# Patient Record
Sex: Female | Born: 1955 | Race: White | Hispanic: No | Smoking: Never smoker
Health system: Southern US, Community
[De-identification: ages and names within clinical notes are randomized; demographics above are authoritative.]

## PROBLEM LIST (undated history)

## (undated) DIAGNOSIS — N289 Disorder of kidney and ureter, unspecified: Secondary | ICD-10-CM

## (undated) DIAGNOSIS — K760 Fatty (change of) liver, not elsewhere classified: Secondary | ICD-10-CM

## (undated) DIAGNOSIS — R002 Palpitations: Secondary | ICD-10-CM

## (undated) DIAGNOSIS — Q600 Renal agenesis, unilateral: Secondary | ICD-10-CM

## (undated) DIAGNOSIS — F32A Depression, unspecified: Secondary | ICD-10-CM

## (undated) DIAGNOSIS — R000 Tachycardia, unspecified: Secondary | ICD-10-CM

## (undated) DIAGNOSIS — E119 Type 2 diabetes mellitus without complications: Secondary | ICD-10-CM

## (undated) DIAGNOSIS — F329 Major depressive disorder, single episode, unspecified: Secondary | ICD-10-CM

## (undated) DIAGNOSIS — R42 Dizziness and giddiness: Secondary | ICD-10-CM

## (undated) DIAGNOSIS — R32 Unspecified urinary incontinence: Secondary | ICD-10-CM

## (undated) DIAGNOSIS — I10 Essential (primary) hypertension: Secondary | ICD-10-CM

## (undated) DIAGNOSIS — K589 Irritable bowel syndrome without diarrhea: Secondary | ICD-10-CM

## (undated) DIAGNOSIS — K219 Gastro-esophageal reflux disease without esophagitis: Secondary | ICD-10-CM

## (undated) DIAGNOSIS — I2510 Atherosclerotic heart disease of native coronary artery without angina pectoris: Secondary | ICD-10-CM

## (undated) DIAGNOSIS — E785 Hyperlipidemia, unspecified: Secondary | ICD-10-CM

## (undated) DIAGNOSIS — R600 Localized edema: Secondary | ICD-10-CM

## (undated) DIAGNOSIS — F988 Other specified behavioral and emotional disorders with onset usually occurring in childhood and adolescence: Secondary | ICD-10-CM

## (undated) DIAGNOSIS — L719 Rosacea, unspecified: Secondary | ICD-10-CM

## (undated) HISTORY — DX: Fatty (change of) liver, not elsewhere classified: K76.0

## (undated) HISTORY — PX: APPENDECTOMY: SHX54

## (undated) HISTORY — DX: Other specified behavioral and emotional disorders with onset usually occurring in childhood and adolescence: F98.8

## (undated) HISTORY — DX: Unspecified urinary incontinence: R32

## (undated) HISTORY — DX: Dizziness and giddiness: R42

## (undated) HISTORY — PX: LAPAROSCOPIC GASTRIC BANDING: SHX1100

## (undated) HISTORY — DX: Irritable bowel syndrome, unspecified: K58.9

## (undated) HISTORY — PX: KNEE SURGERY: SHX244

## (undated) HISTORY — DX: Localized edema: R60.0

## (undated) HISTORY — DX: Disorder of kidney and ureter, unspecified: N28.9

## (undated) HISTORY — DX: Rosacea, unspecified: L71.9

## (undated) HISTORY — DX: Hyperlipidemia, unspecified: E78.5

## (undated) HISTORY — PX: ROTATOR CUFF REPAIR: SHX139

## (undated) HISTORY — DX: Renal agenesis, unilateral: Q60.0

## (undated) HISTORY — PX: TONSILLECTOMY: SUR1361

## (undated) HISTORY — DX: Palpitations: R00.2

## (undated) HISTORY — PX: CHOLECYSTECTOMY: SHX55

## (undated) HISTORY — DX: Major depressive disorder, single episode, unspecified: F32.9

## (undated) HISTORY — DX: Tachycardia, unspecified: R00.0

## (undated) HISTORY — DX: Type 2 diabetes mellitus without complications: E11.9

## (undated) HISTORY — PX: ABDOMINAL HYSTERECTOMY: SHX81

## (undated) HISTORY — DX: Gastro-esophageal reflux disease without esophagitis: K21.9

## (undated) HISTORY — DX: Depression, unspecified: F32.A

---

## 2012-06-29 ENCOUNTER — Emergency Department (HOSPITAL_COMMUNITY)
Admission: EM | Admit: 2012-06-29 | Discharge: 2012-06-30 | Disposition: A | Discharge status: Home / Self Care | Payer: BC Managed Care – PPO | Attending: Emergency Medicine | Admitting: Emergency Medicine

## 2012-06-29 ENCOUNTER — Emergency Department (HOSPITAL_COMMUNITY): Payer: BC Managed Care – PPO

## 2012-06-29 ENCOUNTER — Encounter (HOSPITAL_COMMUNITY): Payer: Self-pay | Admitting: *Deleted

## 2012-06-29 DIAGNOSIS — I10 Essential (primary) hypertension: Secondary | ICD-10-CM | POA: Insufficient documentation

## 2012-06-29 DIAGNOSIS — Y9389 Activity, other specified: Secondary | ICD-10-CM | POA: Insufficient documentation

## 2012-06-29 DIAGNOSIS — S46909A Unspecified injury of unspecified muscle, fascia and tendon at shoulder and upper arm level, unspecified arm, initial encounter: Secondary | ICD-10-CM | POA: Insufficient documentation

## 2012-06-29 DIAGNOSIS — W19XXXA Unspecified fall, initial encounter: Secondary | ICD-10-CM

## 2012-06-29 DIAGNOSIS — Z79899 Other long term (current) drug therapy: Secondary | ICD-10-CM | POA: Insufficient documentation

## 2012-06-29 DIAGNOSIS — I2510 Atherosclerotic heart disease of native coronary artery without angina pectoris: Secondary | ICD-10-CM | POA: Insufficient documentation

## 2012-06-29 DIAGNOSIS — M25519 Pain in unspecified shoulder: Secondary | ICD-10-CM

## 2012-06-29 DIAGNOSIS — Z7982 Long term (current) use of aspirin: Secondary | ICD-10-CM | POA: Insufficient documentation

## 2012-06-29 DIAGNOSIS — W010XXA Fall on same level from slipping, tripping and stumbling without subsequent striking against object, initial encounter: Secondary | ICD-10-CM | POA: Insufficient documentation

## 2012-06-29 DIAGNOSIS — Y9289 Other specified places as the place of occurrence of the external cause: Secondary | ICD-10-CM | POA: Insufficient documentation

## 2012-06-29 DIAGNOSIS — M79601 Pain in right arm: Secondary | ICD-10-CM

## 2012-06-29 DIAGNOSIS — S4980XA Other specified injuries of shoulder and upper arm, unspecified arm, initial encounter: Secondary | ICD-10-CM | POA: Insufficient documentation

## 2012-06-29 HISTORY — DX: Essential (primary) hypertension: I10

## 2012-06-29 HISTORY — DX: Atherosclerotic heart disease of native coronary artery without angina pectoris: I25.10

## 2012-06-29 NOTE — ED Notes (Signed)
Pt transported to xray 

## 2012-06-29 NOTE — ED Notes (Signed)
The pt tripped and fell over a curb at the [facility] just pta.   No loc.  C/o  Rt upper arm and  Rt eyebrow  Laceration where her glasses  Were broken.

## 2012-06-29 NOTE — ED Notes (Signed)
ED PA at bedside

## 2012-06-29 NOTE — ED Provider Notes (Signed)
History    This chart was scribed for non-physician practitioner Raymon Mutton working with Carleene Cooper III, MD by Quintella Reichert, ED Scribe. This patient was seen in room TR06C/TR06C and the patient's care was started at 11:14 PM .   CSN: [id number]  Arrival date & time 06/29/12  2137      Chief Complaint  Patient presents with  . Fall     The history is provided by the patient. No language interpreter was used.   Mallory Charles is a 57 y.o. female who presents to the Emergency Department due to a fall over a curb several hours ago, with subsequent upper right arm pain that is exacerbated by arm movement.  Pt reports that she fell forward, but does not recall exactly what position she fell in. She denies LOC or head trauma with fall.  Pt denies visual changes, dizziness, CP, SOB, difficulty breathing, ear pain, tinnitus, HA, abdominal pain, back pain, neck pain, weakness, or any other associated symptoms.  Pt has h/o HTN and CAD.   Past Medical History  Diagnosis Date  . Hypertension   . Coronary artery disease     History reviewed. No pertinent past surgical history.  No family history on file.  History  Substance Use Topics  . Smoking status: Never Smoker   . Smokeless tobacco: Not on file  . Alcohol Use: No    OB History   Grav Para Term Preterm Abortions TAB SAB Ect Mult Living                  Review of Systems  HENT: Negative for ear pain, neck pain and tinnitus.   Eyes: Negative for visual disturbance.  Respiratory: Negative for shortness of breath.   Cardiovascular: Negative for chest pain.  Gastrointestinal: Negative for abdominal pain.  Musculoskeletal: Negative for back pain.       Upper right arm pain  Neurological: Negative for dizziness, weakness and headaches.  All other systems reviewed and are negative.    Allergies  Morphine and related  Home Medications   Current Outpatient Rx  Name  Route  Sig  Dispense  Refill  . aspirin 81  MG chewable tablet   Oral   Chew 81 mg by mouth daily.         . cholecalciferol (VITAMIN D) 1000 UNITS tablet   Oral   Take 1,000 Units by mouth daily.         . DULoxetine (CYMBALTA) 20 MG capsule   Oral   Take 20 mg by mouth daily.         Marland Kitchen losartan-hydrochlorothiazide (HYZAAR) 50-12.5 MG per tablet   Oral   Take 1 tablet by mouth daily.         . metFORMIN (GLUCOPHAGE-XR) 750 MG 24 hr tablet   Oral   Take 750 mg by mouth 2 (two) times daily.         . metoprolol succinate (TOPROL-XL) 25 MG 24 hr tablet   Oral   Take 12.5 mg by mouth daily.         Marland Kitchen omeprazole (PRILOSEC) 40 MG capsule   Oral   Take 40 mg by mouth daily.         Marland Kitchen HYDROcodone-acetaminophen (NORCO) 5-325 MG per tablet   Oral   Take 1 tablet by mouth every 6 (six) hours as needed for pain.   6 tablet   0     BP 133/62  Pulse 93  Temp(Src) 97.9  F (36.6 C) (Oral)  Resp 18  Ht 5' 5.5" (1.664 m)  Wt 309 lb (140.161 kg)  BMI 50.62 kg/m2  SpO2 100%  Physical Exam  Nursing note and vitals reviewed. Constitutional: She is oriented to person, place, and time. She appears well-developed and well-nourished. No distress.  HENT:  Head: Normocephalic and atraumatic.  Mouth/Throat: Oropharynx is clear and moist. No oropharyngeal exudate.  Eyes: Conjunctivae and EOM are normal. Pupils are equal, round, and reactive to light. Right eye exhibits no discharge. Left eye exhibits no discharge.  Neck: Normal range of motion. Neck supple. No tracheal deviation present.  Cardiovascular: Normal rate, regular rhythm and normal heart sounds.   No murmur heard. Radial pulses 2+ bilaterally.  Pedal pulses 2+ bilaterally.  Pulmonary/Chest: Effort normal and breath sounds normal. No respiratory distress. She has no wheezes. She has no rales.  Musculoskeletal: She exhibits tenderness (Tenderness on palpation to right humerus. ).  No swelling, erythema, inflammation noted to upper extremities and lower  extremities bilaterally. Mild discomfort upon palpation to right shoulder - pain with palpation to right humerus. No pain upon palpation to right elbow, right forearm, right hand. Patient unable to move right shoulder due to pain in right humerus. Full ROM to right elbow, right wrist, right hand.    Pain upon motion to left shoulder, has full ROM without pain to left elbow, left wrist, left hand.   Lymphadenopathy:    She has no cervical adenopathy.  Neurological: She is alert and oriented to person, place, and time. No cranial nerve deficit. She exhibits normal muscle tone.  Skin: Skin is warm and dry. No rash noted. No erythema.  No abrasions to lower extremities. Superficial abrasion to right medial epicondyle. 2 small linear scrapes above right eyebrow, tiny scrapes to right eyelid.   Psychiatric: She has a normal mood and affect. Her behavior is normal. Thought content normal.    ED Course  Procedures (including critical care time)  DIAGNOSTIC STUDIES: Oxygen Saturation is 100% on room air, normal by my interpretation.    COORDINATION OF CARE: 11:21 PM-Discussed treatment plan which includes pain medication and imaging with pt at bedside and pt agreed to plan.      Labs Reviewed - No data to display Dg Shoulder Right  06/30/2012  *RADIOLOGY REPORT*  Clinical Data: Status post fall; right shoulder pain.  RIGHT SHOULDER - 2+ VIEW  Comparison: None.  Findings: There is no evidence of fracture or dislocation.  The right humeral head is seated within the glenoid fossa. Degenerative change is noted at the right acromioclavicular joint. No significant soft tissue abnormalities are seen.  The visualized portions of the right lung are clear.  IMPRESSION: No evidence of fracture or dislocation.   Original Report Authenticated By: Tonia Ghent, M.D.    Dg Elbow Complete Right  06/30/2012  *RADIOLOGY REPORT*  Clinical Data: Status post fall, with right elbow pain and small abrasion at the  posterior aspect of the elbow.  RIGHT ELBOW - COMPLETE 3+ VIEW  Comparison: None.  Findings: There is a small apparent cortical defect on a single lateral view, at the dorsal aspect of the radial head.  This is thought to be artifactual in nature, given the lack of an associated joint effusion, and question of underlying intact cortex on that view.  There is no additional evidence for fracture.  The visualized joint spaces are preserved.  No significant joint effusion is identified. The known soft tissue abrasion is not well characterized  on radiograph.  IMPRESSION: No definite evidence of fracture or dislocation.  Small apparent cortical defect at the dorsal aspect of the radial head on a single lateral view is thought to be artifactual in nature.   Original Report Authenticated By: Tonia Ghent, M.D.    Dg Wrist Complete Right  06/30/2012  *RADIOLOGY REPORT*  Clinical Data: Status post fall; right wrist pain.  RIGHT WRIST - COMPLETE 3+ VIEW  Comparison: None.  Findings: There is no evidence of acute fracture or dislocation. A small osseous fragment distal to the ulnar styloid may reflect remote injury.  The carpal rows are intact, and demonstrate normal alignment.  The joint spaces are preserved.  No significant soft tissue abnormalities are seen.  IMPRESSION: No evidence of acute fracture or dislocation.   Original Report Authenticated By: Tonia Ghent, M.D.    Dg Shoulder Left  06/30/2012  *RADIOLOGY REPORT*  Clinical Data: Status post fall; left shoulder pain.  LEFT SHOULDER - 2+ VIEW  Comparison: None.  Findings: There is no evidence of fracture or dislocation.  The left humeral head is seated within the glenoid fossa.  The acromioclavicular joint is unremarkable in appearance.  Note is made of a calcification at the distal insertion of the left rotator cuff, compatible with calcific tendinitis.  The visualized portions of the left lung are clear.  IMPRESSION:  1.  No evidence of fracture or  dislocation. 2.  Findings compatible with calcific tendinitis at the left rotator cuff.   Original Report Authenticated By: Tonia Ghent, M.D.    Dg Humerus Right  06/30/2012  *RADIOLOGY REPORT*  Clinical Data: Status post fall; right arm injury, with pain.  RIGHT HUMERUS - 2+ VIEW  Comparison: None.  Findings: There is no evidence of fracture or dislocation.  The right humerus appears intact.  The right humeral head remains seated at the glenoid fossa.  Mild degenerative change is noted at the right acromioclavicular joint.  The elbow joint is assessed on concurrent elbow radiographs.  No significant soft tissue abnormalities are characterized.  IMPRESSION: No evidence of fracture or dislocation.   Original Report Authenticated By: Tonia Ghent, M.D.      1. Fall, initial encounter   2. Shoulder pain, unspecified laterality   3. Right arm pain       MDM  Patient is afebrile, normotensive, non-tachycardic, alert and oriented. Negative findings for fractures and dislocations to right shoulder, right humerus, right elbow, right wrist, and left shoulder. Due to pain in right humerus and limited motion to right shoulder patient placed in sling. Pain controlled in ED setting. Discharged patient with pain medications - discussed to not drink, drive, operate heavy machinery while taking pain medications. Stressed importance of following up with orthopedics in regards to injury. Discussed with patient to follow-up with PCP, [facility]. Discussed with patient on wound care to abrasions. Discussed with patient to continue to stay in sling until seen by orthopedics - discussed to elevate, ice, rest arm. Discussed with patient to monitor symptoms and if symptoms are to worsen or change to report back to the ED. Patient agreed to plan of care, understood, all questions answered.    I personally performed the services described in this documentation, which was scribed in my presence. The recorded  information has been reviewed and is accurate.   Raymon Mutton, PA-C 06/30/12 0320 No neurovascular damage noted.   Raymon Mutton, PA-C 06/30/12 [id number]

## 2012-06-30 MED ORDER — FENTANYL CITRATE 0.05 MG/ML IJ SOLN
2.0000 ug/kg | Freq: Once | INTRAMUSCULAR | Status: DC
  Filled 2012-06-30: qty 6

## 2012-06-30 MED ORDER — HYDROCODONE-ACETAMINOPHEN 5-325 MG PO TABS: 1.0000 | ORAL_TABLET | Freq: Four times a day (QID) | ORAL | Status: DC | PRN

## 2012-06-30 MED ORDER — FENTANYL CITRATE 0.05 MG/ML IJ SOLN
50.0000 ug | Freq: Once | INTRAMUSCULAR | Status: AC
  Administered 2012-06-30: 50 ug via NASAL
  Filled 2012-06-30: qty 2

## 2012-06-30 NOTE — ED Notes (Signed)
Pt comfortable with discharge and f/u instructions. Prescription x1

## 2012-07-02 NOTE — ED Provider Notes (Signed)
Medical screening examination/treatment/procedure(s) were performed by non-physician practitioner and as supervising physician I was immediately available for consultation/collaboration.   Carleene Cooper III, MD 07/02/12 1115

## 2013-03-08 ENCOUNTER — Ambulatory Visit: Payer: BC Managed Care – PPO

## 2013-03-08 ENCOUNTER — Ambulatory Visit (INDEPENDENT_AMBULATORY_CARE_PROVIDER_SITE_OTHER): Payer: BC Managed Care – PPO | Admitting: Family Medicine

## 2013-03-08 VITALS — BP 132/70 | HR 80 | Temp 98.2°F | Resp 18 | Wt 306.0 lb

## 2013-03-08 DIAGNOSIS — M25572 Pain in left ankle and joints of left foot: Secondary | ICD-10-CM

## 2013-03-08 DIAGNOSIS — M25579 Pain in unspecified ankle and joints of unspecified foot: Secondary | ICD-10-CM

## 2013-03-08 NOTE — Progress Notes (Signed)
Subjective: Last night went and bent over to give someone a cast and she bumped her foot into the chair. She says it is felt like popping and cracking and crackling. He continues to hurt.  Objective: Left ankle is mildly swollen, more medially. It is moderately tender, more on the medial malleolus. Dorsiflexion, plantarflexion, inversion, and eversion, all cause some pain. Pulses are good. Motion of the toes is good. Toes are nontender.  [facility] reading (PRIMARY) by  Dr. Alwyn Ren Ankle sprain  Assessment: Ankle sprain and pain  Plan: Swede-O Ibuprofen Return if worse Use her own crutches as needed.

## 2013-03-08 NOTE — Patient Instructions (Signed)
Use the crutches you have at home as needed  Wear the ankle brace whenever you're going to be up and around  Take ibuprofen 6-800 mg 3 times daily as needed for pain and inflammation  Return if not improving over the next week or so  Try to elevate as much as you can and use ice packs for 5 times daily on it

## 2014-05-26 IMAGING — CR DG SHOULDER 2+V*L*
3 series · 3 of 3 positions shown · non-contrast
Comparison: None.

CLINICAL DATA: Status post fall; left shoulder pain.

LEFT SHOULDER - 2+ VIEW

[t shoulder internal left]
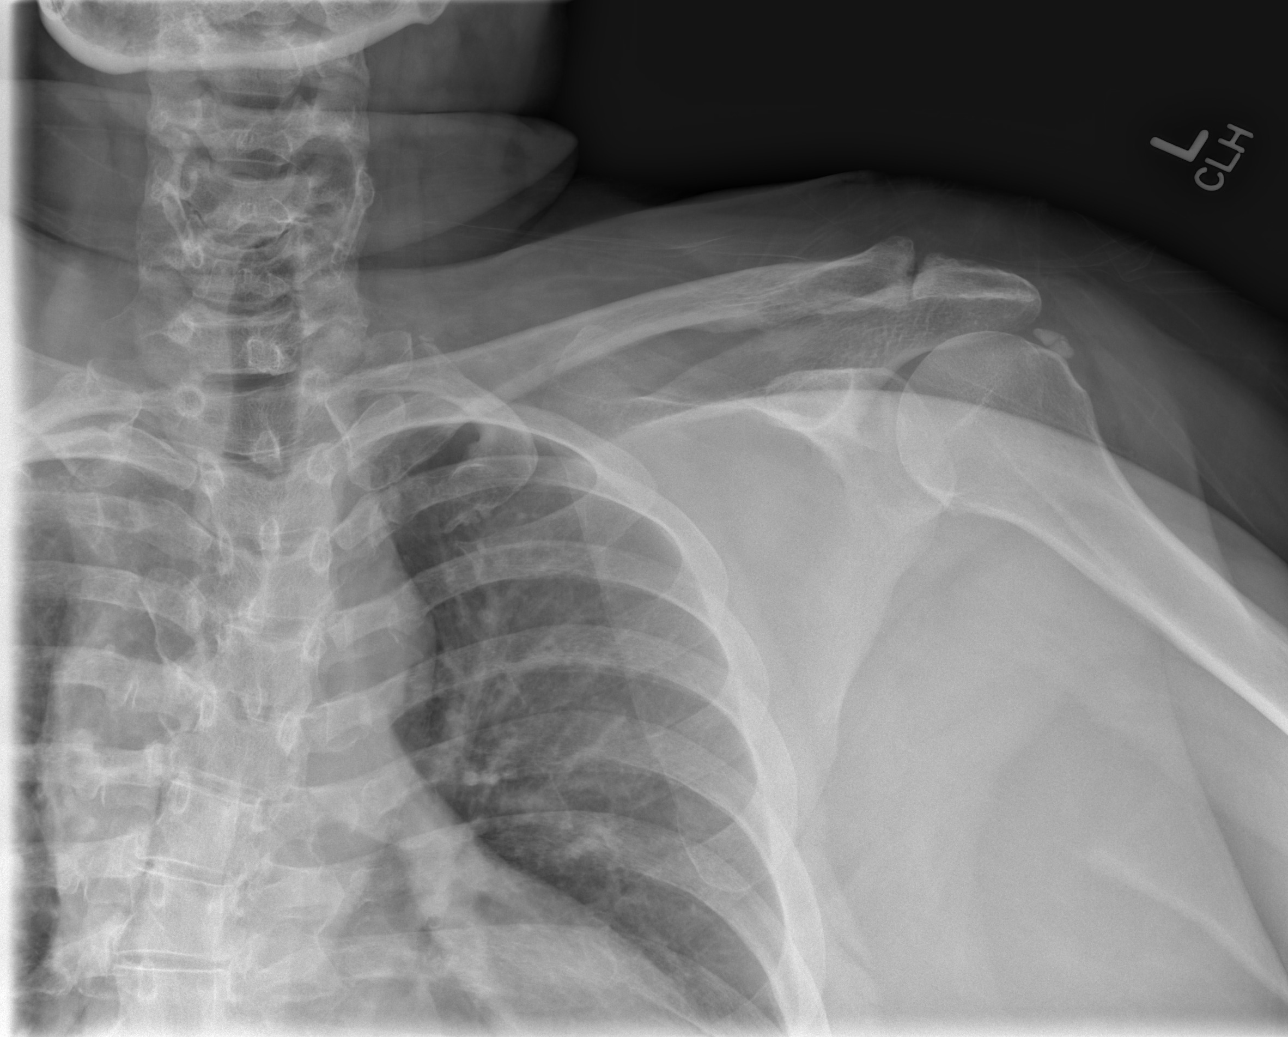

[t shoulder external left]
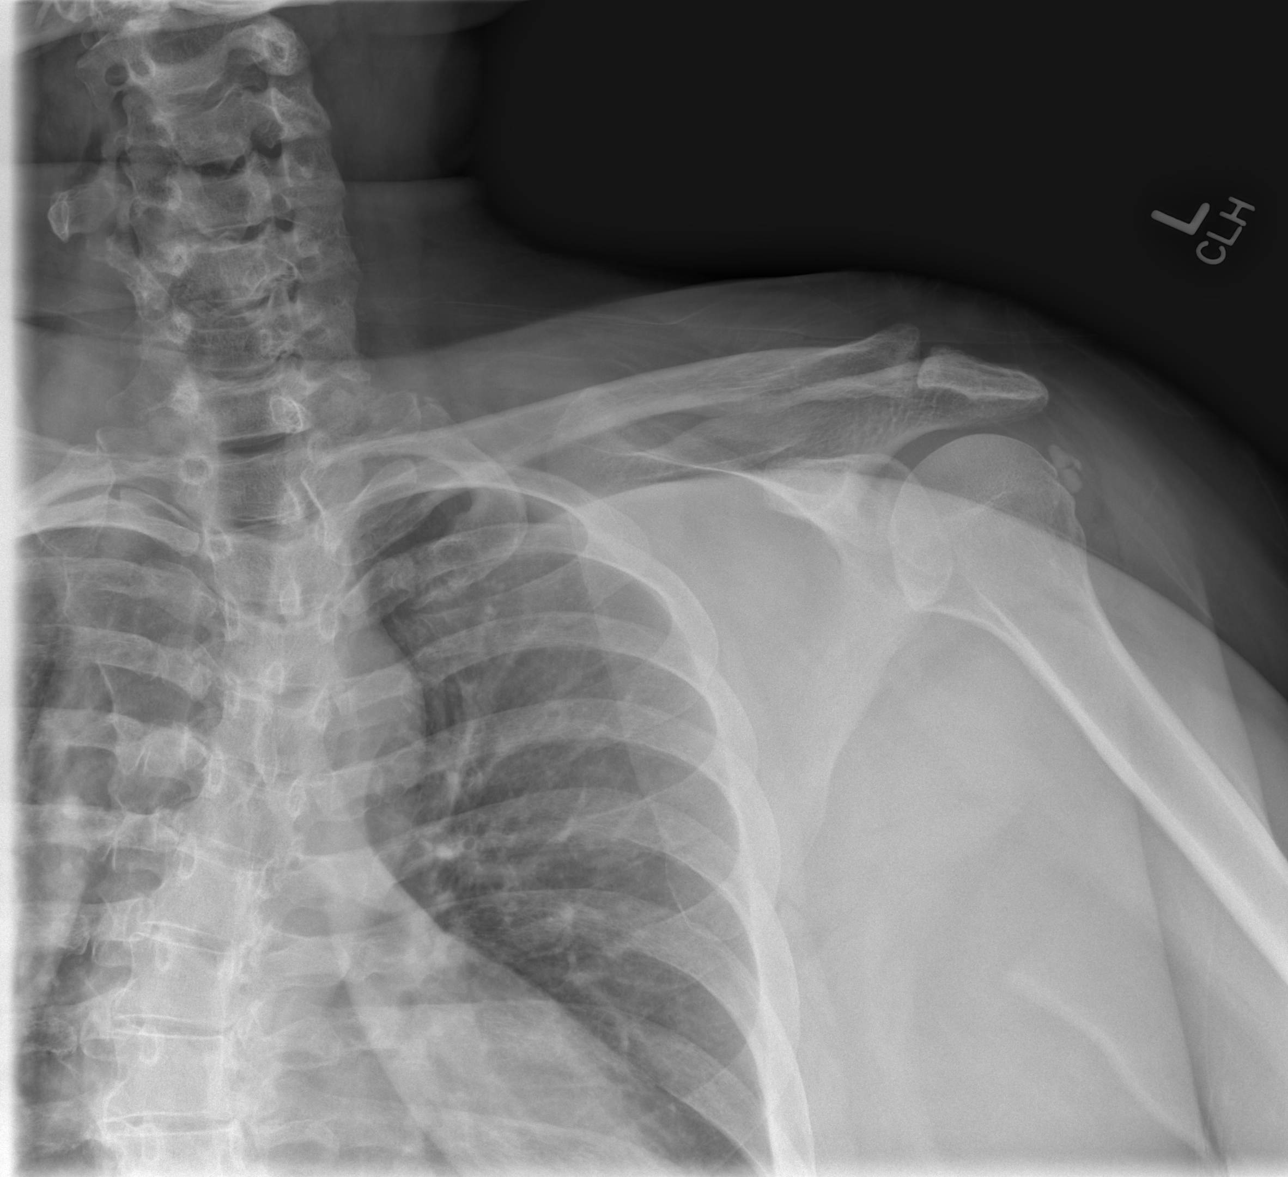

[t shoulder y-view left]
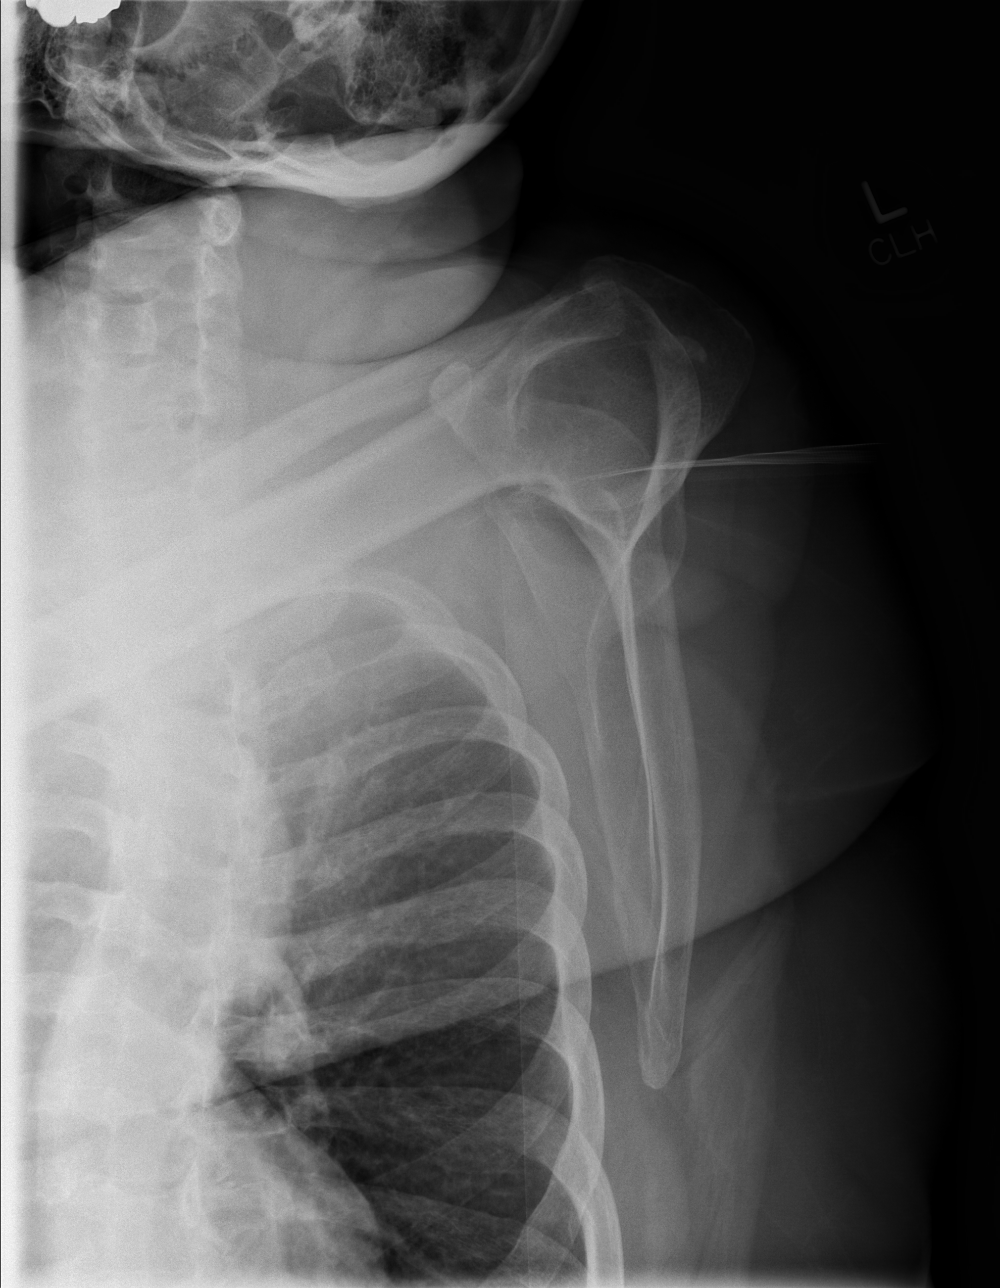

[3 of 3 positions shown; findings below may reference images not displayed]

FINDINGS: There is no evidence of fracture or dislocation.  The
left humeral head is seated within the glenoid fossa.  The
acromioclavicular joint is unremarkable in appearance.

Note is made of a calcification at the distal insertion of the left
rotator cuff, compatible with calcific tendinitis.  The visualized
portions of the left lung are clear.
IMPRESSION: 1.  No evidence of fracture or dislocation.
2.  Findings compatible with calcific tendinitis at the left
rotator cuff.

## 2014-07-30 ENCOUNTER — Other Ambulatory Visit: Payer: Self-pay | Admitting: Surgical Oncology

## 2014-07-30 DIAGNOSIS — IMO0001 Reserved for inherently not codable concepts without codable children: Secondary | ICD-10-CM

## 2014-07-30 DIAGNOSIS — K219 Gastro-esophageal reflux disease without esophagitis: Principal | ICD-10-CM

## 2014-09-02 ENCOUNTER — Ambulatory Visit
Admission: RE | Admit: 2014-09-02 | Discharge: 2014-09-02 | Disposition: A | Discharge status: Home / Self Care | Payer: BC Managed Care – PPO | Source: Ambulatory Visit | Attending: Surgical Oncology | Admitting: Surgical Oncology

## 2014-09-02 DIAGNOSIS — K219 Gastro-esophageal reflux disease without esophagitis: Principal | ICD-10-CM

## 2014-09-02 DIAGNOSIS — IMO0001 Reserved for inherently not codable concepts without codable children: Secondary | ICD-10-CM

## 2015-02-02 IMAGING — CR DG ANKLE COMPLETE 3+V*L*
4 series · 4 of 4 positions shown · non-contrast
Comparison: None.

CLINICAL DATA: Left ankle pain.

EXAM:
LEFT ANKLE COMPLETE - 3+ VIEW

[AP]
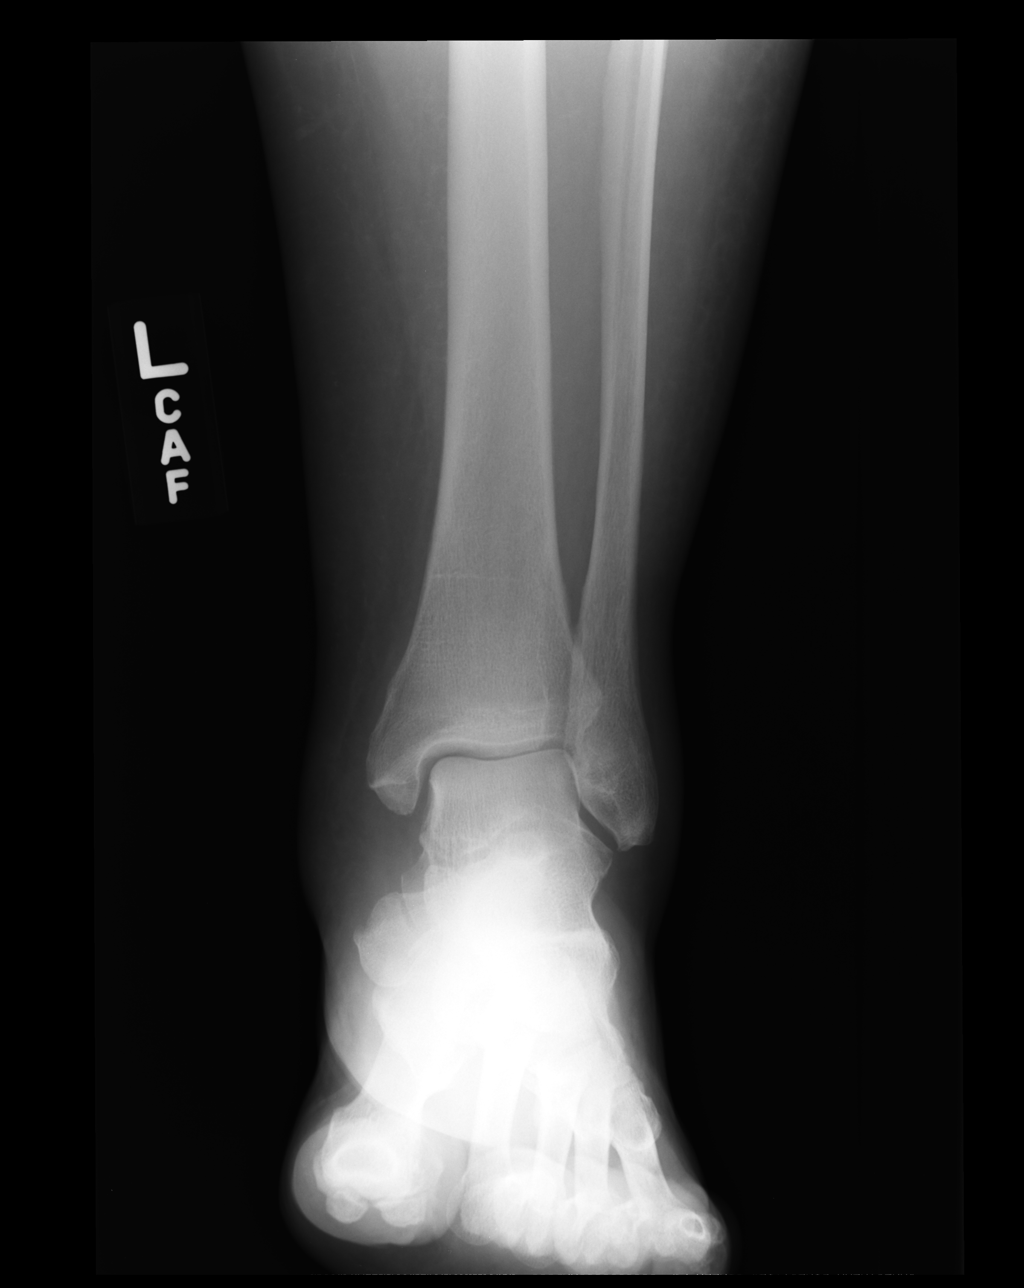

[ap obl int rot]
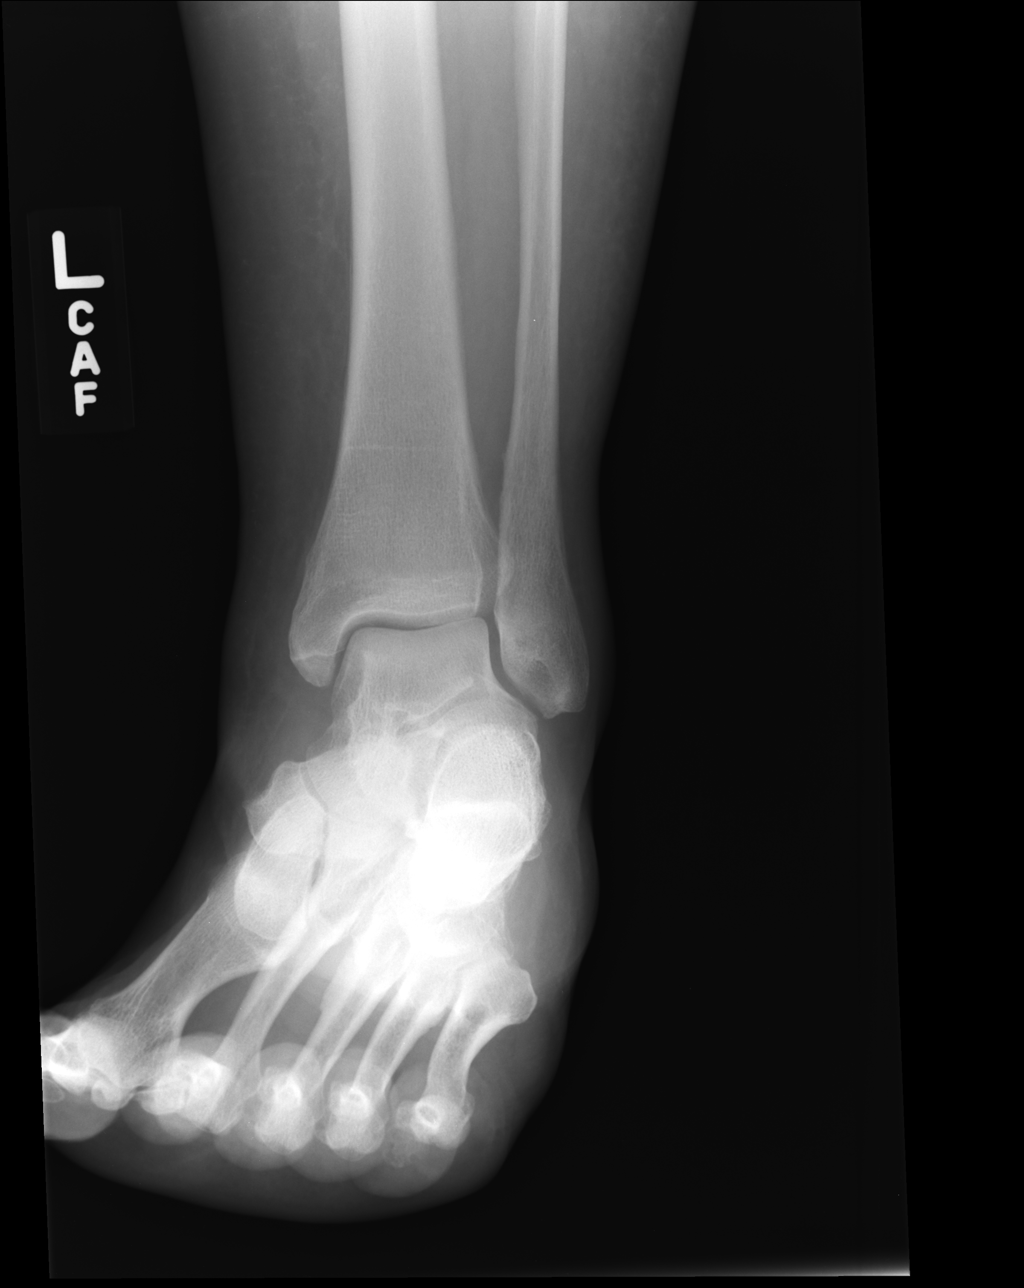

[ap obl ext rot]
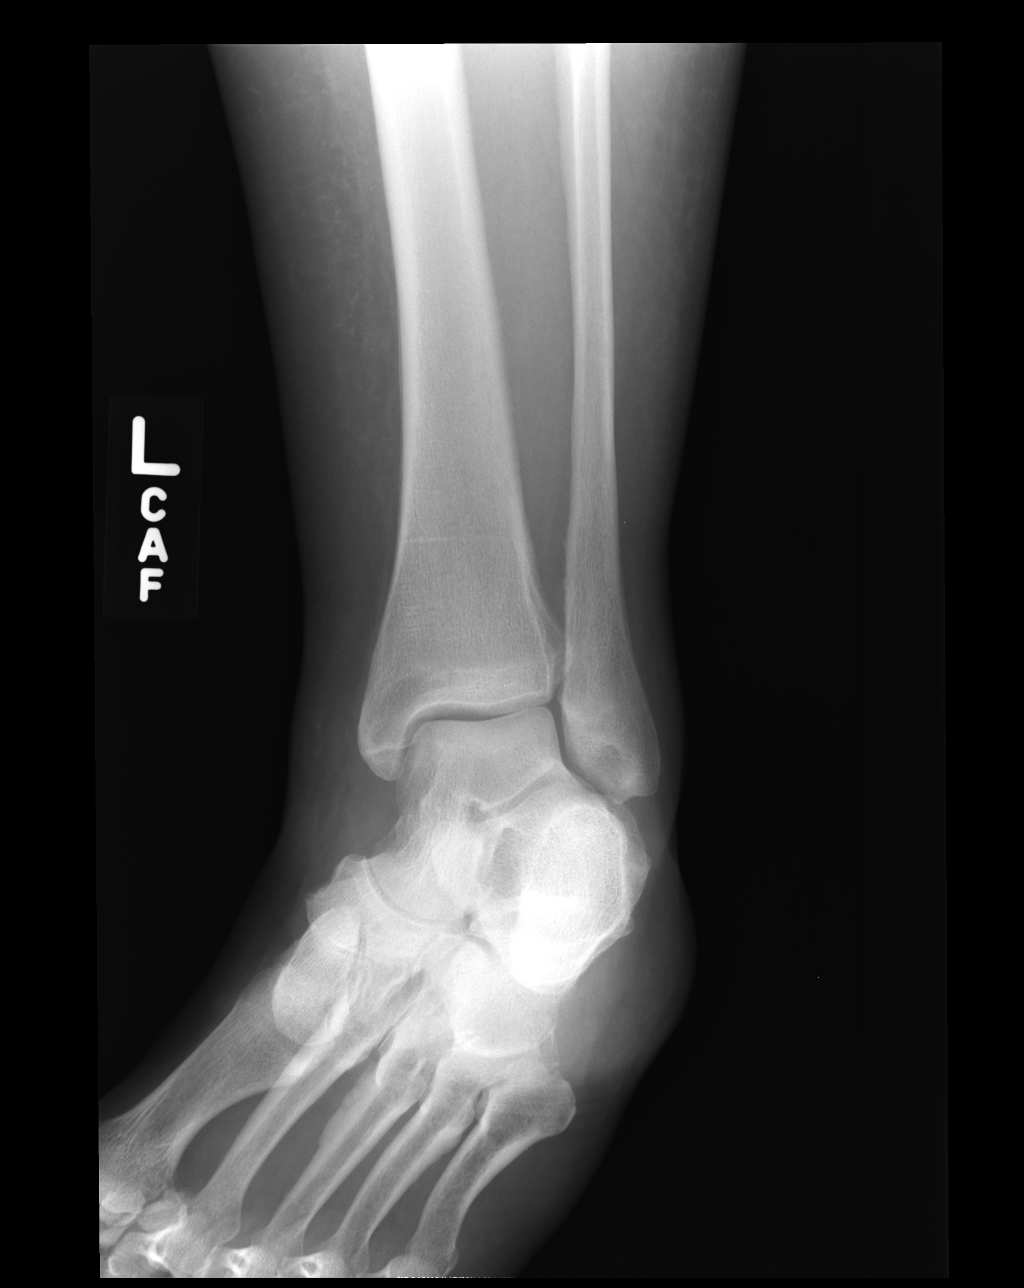

[lateral]
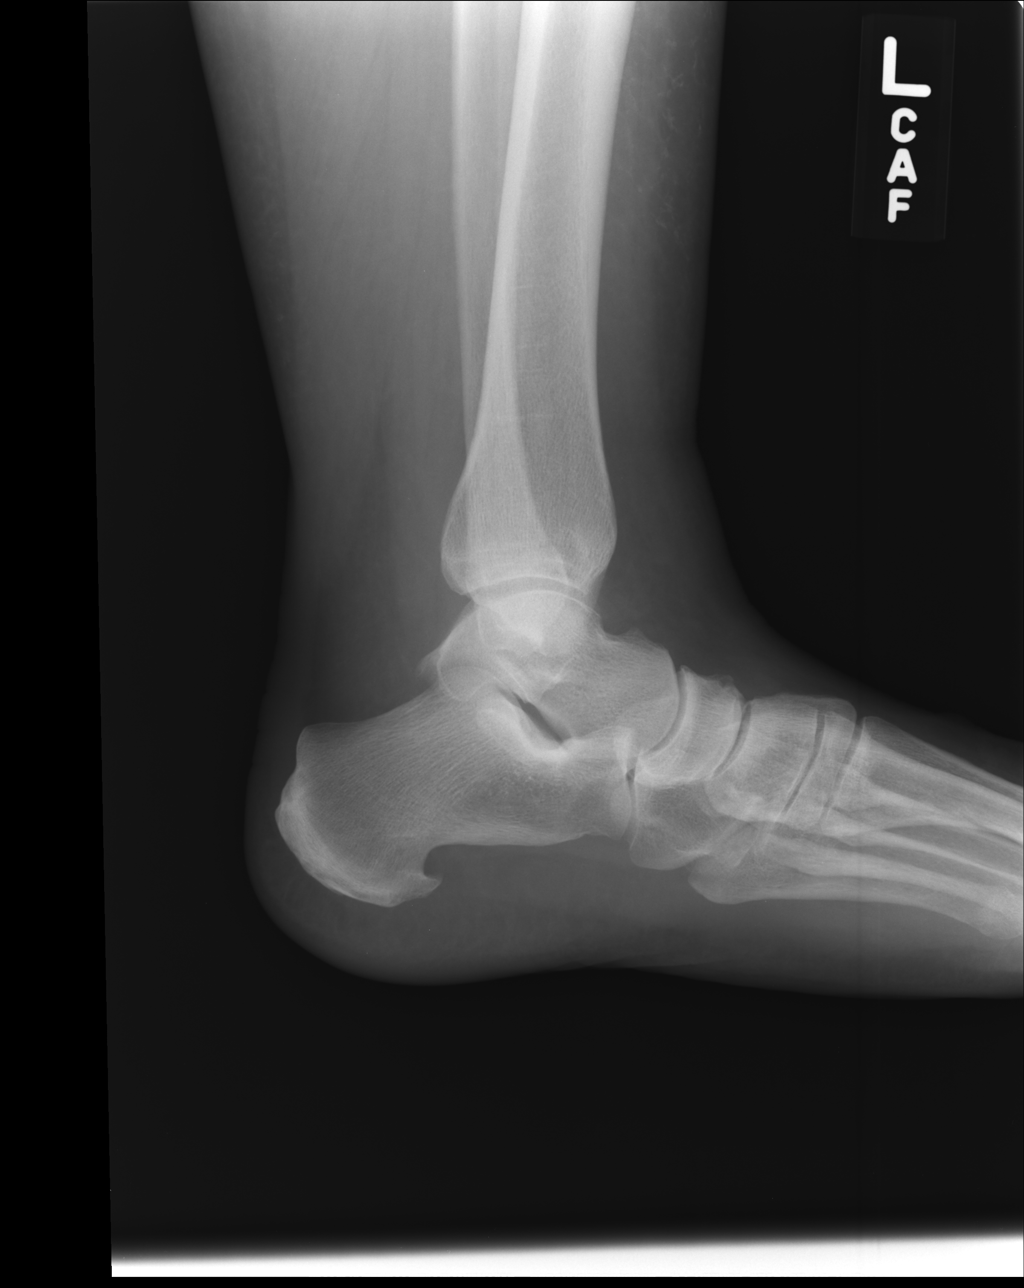

[4 of 4 positions shown; findings below may reference images not displayed]

FINDINGS: No acute bony or joint abnormality is identified. Plantar calcaneal
spur is noted.
IMPRESSION: No acute finding.

## 2016-06-05 ENCOUNTER — Other Ambulatory Visit: Payer: Self-pay | Admitting: Surgical Oncology

## 2016-06-05 DIAGNOSIS — R1013 Epigastric pain: Secondary | ICD-10-CM

## 2016-06-05 DIAGNOSIS — K219 Gastro-esophageal reflux disease without esophagitis: Secondary | ICD-10-CM

## 2016-06-13 ENCOUNTER — Other Ambulatory Visit: Payer: Self-pay | Admitting: Surgical Oncology

## 2016-06-13 ENCOUNTER — Ambulatory Visit
Admission: RE | Admit: 2016-06-13 | Discharge: 2016-06-13 | Disposition: A | Discharge status: Home / Self Care | Payer: BC Managed Care – PPO | Source: Ambulatory Visit | Attending: Surgical Oncology | Admitting: Surgical Oncology

## 2016-06-13 DIAGNOSIS — R1013 Epigastric pain: Secondary | ICD-10-CM

## 2016-06-13 DIAGNOSIS — K219 Gastro-esophageal reflux disease without esophagitis: Secondary | ICD-10-CM

## 2020-09-08 ENCOUNTER — Ambulatory Visit (INDEPENDENT_AMBULATORY_CARE_PROVIDER_SITE_OTHER): Payer: BC Managed Care – PPO | Admitting: Family Medicine

## 2020-09-08 ENCOUNTER — Encounter (INDEPENDENT_AMBULATORY_CARE_PROVIDER_SITE_OTHER): Payer: Self-pay | Admitting: Family Medicine

## 2020-09-08 ENCOUNTER — Other Ambulatory Visit: Payer: Self-pay

## 2020-09-08 VITALS — BP 110/71 | HR 56 | Temp 98.2°F | Ht 64.0 in | Wt 277.0 lb

## 2020-09-08 DIAGNOSIS — Z9189 Other specified personal risk factors, not elsewhere classified: Secondary | ICD-10-CM

## 2020-09-08 DIAGNOSIS — R0602 Shortness of breath: Secondary | ICD-10-CM

## 2020-09-08 DIAGNOSIS — E1169 Type 2 diabetes mellitus with other specified complication: Secondary | ICD-10-CM | POA: Diagnosis not present

## 2020-09-08 DIAGNOSIS — E559 Vitamin D deficiency, unspecified: Secondary | ICD-10-CM | POA: Diagnosis not present

## 2020-09-08 DIAGNOSIS — R5383 Other fatigue: Secondary | ICD-10-CM | POA: Diagnosis not present

## 2020-09-08 DIAGNOSIS — Z0289 Encounter for other administrative examinations: Secondary | ICD-10-CM

## 2020-09-08 DIAGNOSIS — F3289 Other specified depressive episodes: Secondary | ICD-10-CM | POA: Diagnosis not present

## 2020-09-08 DIAGNOSIS — E785 Hyperlipidemia, unspecified: Secondary | ICD-10-CM

## 2020-09-08 DIAGNOSIS — E538 Deficiency of other specified B group vitamins: Secondary | ICD-10-CM

## 2020-09-08 DIAGNOSIS — Z6841 Body Mass Index (BMI) 40.0 and over, adult: Secondary | ICD-10-CM

## 2020-09-08 DIAGNOSIS — E66813 Obesity, class 3: Secondary | ICD-10-CM

## 2020-09-09 LAB — COMPREHENSIVE METABOLIC PANEL
ALT: 19 IU/L (ref 0–32)
AST: 24 IU/L (ref 0–40)
Albumin/Globulin Ratio: 1.3 (ref 1.2–2.2)
Albumin: 4 g/dL (ref 3.8–4.8)
Alkaline Phosphatase: 111 IU/L (ref 44–121)
BUN/Creatinine Ratio: 17 (ref 12–28)
BUN: 13 mg/dL (ref 8–27)
Bilirubin Total: 0.5 mg/dL (ref 0.0–1.2)
CO2: 19 mmol/L — ABNORMAL LOW (ref 20–29)
Calcium: 8.6 mg/dL — ABNORMAL LOW (ref 8.7–10.3)
Chloride: 107 mmol/L — ABNORMAL HIGH (ref 96–106)
Creatinine, Ser: 0.77 mg/dL (ref 0.57–1.00)
Globulin, Total: 3 g/dL (ref 1.5–4.5)
Glucose: 130 mg/dL — ABNORMAL HIGH (ref 65–99)
Potassium: 4 mmol/L (ref 3.5–5.2)
Sodium: 145 mmol/L — ABNORMAL HIGH (ref 134–144)
Total Protein: 7 g/dL (ref 6.0–8.5)
eGFR: 87 mL/min/{1.73_m2} (ref >59)

## 2020-09-09 LAB — CBC WITH DIFFERENTIAL
Basophils Absolute: 0.1 10*3/uL (ref 0.0–0.2)
Basos: 1 %
EOS (ABSOLUTE): 0.1 10*3/uL (ref 0.0–0.4)
Eos: 2 %
Hematocrit: 44.7 % (ref 34.0–46.6)
Hemoglobin: 14.8 g/dL (ref 11.1–15.9)
Immature Grans (Abs): 0 10*3/uL (ref 0.0–0.1)
Immature Granulocytes: 0 %
Lymphocytes Absolute: 1.5 10*3/uL (ref 0.7–3.1)
Lymphs: 24 %
MCH: 30.9 pg (ref 26.6–33.0)
MCHC: 33.1 g/dL (ref 31.5–35.7)
MCV: 93 fL (ref 79–97)
Monocytes Absolute: 0.4 10*3/uL (ref 0.1–0.9)
Monocytes: 7 %
Neutrophils Absolute: 4.1 10*3/uL (ref 1.4–7.0)
Neutrophils: 66 %
RBC: 4.79 x10E6/uL (ref 3.77–5.28)
RDW: 14.4 % (ref 11.7–15.4)
WBC: 6.2 10*3/uL (ref 3.4–10.8)

## 2020-09-09 LAB — T3: T3, Total: 120 ng/dL (ref 71–180)

## 2020-09-09 LAB — LIPID PANEL WITH LDL/HDL RATIO
Cholesterol, Total: 155 mg/dL (ref 100–199)
HDL: 34 mg/dL — ABNORMAL LOW (ref >39)
LDL Chol Calc (NIH): 95 mg/dL (ref 0–99)
LDL/HDL Ratio: 2.8 ratio (ref 0.0–3.2)
Triglycerides: 145 mg/dL (ref 0–149)
VLDL Cholesterol Cal: 26 mg/dL (ref 5–40)

## 2020-09-09 LAB — INSULIN, RANDOM: INSULIN: 6.2 u[IU]/mL (ref 2.6–24.9)

## 2020-09-09 LAB — HEMOGLOBIN A1C
Est. average glucose Bld gHb Est-mCnc: 143 mg/dL
Hgb A1c MFr Bld: 6.6 % — ABNORMAL HIGH (ref 4.8–5.6)

## 2020-09-09 LAB — VITAMIN D 25 HYDROXY (VIT D DEFICIENCY, FRACTURES): Vit D, 25-Hydroxy: 40.8 ng/mL (ref 30.0–100.0)

## 2020-09-09 LAB — VITAMIN B12: Vitamin B-12: 917 pg/mL (ref 232–1245)

## 2020-09-09 LAB — T4: T4, Total: 7.7 ug/dL (ref 4.5–12.0)

## 2020-09-09 LAB — TSH: TSH: 0.904 u[IU]/mL (ref 0.450–4.500)

## 2020-09-09 LAB — FOLATE: Folate: 9.7 ng/mL (ref >3.0)

## 2020-09-16 NOTE — Progress Notes (Signed)
Chief Complaint:   OBESITY Mallory Charles (MR# [id number]) is a 65 y.o. female who presents for evaluation and treatment of obesity and related comorbidities. Current BMI is Body mass index is 47.55 kg/m. Mallory Charles has been struggling with her weight for many years and has been unsuccessful in either losing weight, maintaining weight loss, or reaching her healthy weight goal.  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 She has gone from 320 lbs to 240 lbs, and she is now 280 lbs.  Mallory Charles is currently in the action stage of change and ready to dedicate time achieving and maintaining a healthier weight. Mallory Charles is interested in becoming our patient and working on intensive lifestyle modifications including (but not limited to) diet and exercise for weight loss.  Mallory Charles's habits were reviewed today and are as follows: Her family eats meals together, she thinks her family will eat healthier with her, her desired weight loss is 152 lbs, she has been heavy most of her life, she started gaining weight after giving birth, her heaviest weight ever was 320 pounds, she is a picky eater and doesn't like to eat healthier foods, she has significant food cravings issues, she snacks frequently in the evenings, she skips meals frequently, she is frequently drinking liquids with calories, she frequently makes poor food choices, she frequently eats larger portions than normal, and she struggles with emotional eating.  Depression Screen Mallory Charles (modified PHQ-9) score was 24.  Depression screen Mallory Charles 2/9 09/08/2020  Decreased Interest 3  Down, Depressed, Hopeless 3  PHQ - 2 Score 6  Altered sleeping 3  Tired, decreased energy 3  Change in appetite 3  Feeling bad or failure about yourself  3  Trouble concentrating 3  Moving slowly or fidgety/restless 3  Suicidal thoughts 0  PHQ-9 Score 24  Difficult doing work/chores Not  difficult at all   Subjective:   1. Other fatigue Mallory Charles admits to daytime somnolence and admits to waking up still tired. Patent has a history of symptoms of daytime fatigue. Mallory Charles generally gets 7 or 8 hours of sleep per night, and states that she has generally restful sleep. Snoring is present. Apneic episodes are not present. Epworth Sleepiness Score is 11.  2. SOB (shortness of breath) on exertion Mallory Charles notes increasing shortness of breath with exercising and seems to be worsening over time with weight gain. She notes getting out of breath sooner with activity than she used to. This has not gotten worse recently. Mallory Charles denies shortness of breath at rest or orthopnea.  3. Type 2 diabetes mellitus with other specified complication, without long-term current use of insulin (HCC) Mallory Charles is on metformin and she had metabolic surgery. She is working on diet and continued weight loss.  4. Hyperlipidemia associated with type 2 diabetes mellitus (HCC) Mallory Charles is working on diet and exercise. She is not on statin and she has a history of supraventricular tachycardia.  5. Vitamin D deficiency Mallory Charles is status post weight loss surgery (duodenal switch). She is due for labs and she notes fatigue.  6. B12 deficiency Mallory Charles is status post weight loss surgery (duodenal switch). She is due for labs and she notes fatigue.  7. Other depression Mallory Charles is on Lexapro currently. 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 She is seeing a counselor every 2 weeks.  8. At risk  for heart disease Mallory Charles is at a higher than average risk for cardiovascular disease due to obesity.   Assessment/Plan:   1. Other fatigue Mallory Charles does feel that her weight is causing her energy to be lower than it should be. Fatigue may be related to obesity, depression or many other causes. Labs will be ordered, and in the meanwhile, Mallory Charles will focus on self care including making healthy  food choices, increasing physical activity and focusing on stress reduction.  - TSH - Folate - T3 - T4 - CBC With Differential - EKG 12-Lead  2. SOB (shortness of breath) on exertion Mallory Charles does feel that she gets out of breath more easily that she used to when she exercises. Nature's shortness of breath appears to be obesity related and exercise induced. She has agreed to work on weight loss and gradually increase exercise to treat her exercise induced shortness of breath. Will continue to monitor closely.  3. Type 2 diabetes mellitus with other specified complication, without long-term current use of insulin (HCC) We will check labs today. Mallory Charles will start on her Category 2 plan. Good blood sugar control is important to decrease the likelihood of diabetic complications such as nephropathy, neuropathy, limb loss, blindness, coronary artery disease, and death. Intensive lifestyle modification including diet, exercise and weight loss are the first line of treatment for diabetes.   - Insulin, random - Comprehensive metabolic panel - Hemoglobin A1c  4. Hyperlipidemia associated with type 2 diabetes mellitus (HCC) Cardiovascular risk and specific lipid/LDL goals reviewed. We discussed several lifestyle modifications today. We will check labs today. Mallory Charles will start her Category 2 plan, and will continue to work on exercise and weight loss efforts. Orders and follow up as documented in patient record.   - Lipid Panel With LDL/HDL Ratio  5. Vitamin D deficiency Low Vitamin D level contributes to fatigue and are associated with obesity, breast, and colon cancer. We will check labs today. Mallory Charles will follow-up for routine testing of Vitamin D, at least 2-3 times per year to avoid over-replacement.  - VITAMIN D 25 Hydroxy (Vit-D Deficiency, Fractures)  6. B12 deficiency The diagnosis was reviewed with the patient. We will check labs today. Mallory Charles will continue to follow up as directed. Orders and  follow up as documented in patient record.  - Vitamin B12  7. Other depression Behavior modification techniques were discussed today to help Mallory Charles deal with her emotional/non-hunger eating behaviors. Mallory Charles will continue to follow up with her counselor and will work on emotional eating behaviors at her visits. Orders and follow up as documented in patient record.   8. At risk for heart disease Mallory Charles was given approximately 30 minutes of coronary artery disease prevention counseling today. She is 65 y.o. female and has risk factors for heart disease including obesity. We discussed intensive lifestyle modifications today with an emphasis on specific weight loss instructions and strategies.   Repetitive spaced learning was employed today to elicit superior memory formation and behavioral change.  9. Obesity with current BMI 47.6 Mallory Charles is currently in the action stage of change and her goal is to continue with weight loss efforts. I recommend Mallory Charles begin the structured treatment plan as follows:  She has agreed to the Category 2 Plan.  Exercise goals: No exercise has been prescribed for now, while we concentrate on nutritional changes.   Behavioral modification strategies: increasing lean protein intake, no skipping meals, meal planning and cooking strategies, and planning for success.  She was informed of the  importance of frequent follow-up visits to maximize her success with intensive lifestyle modifications for her multiple health conditions. She was informed we would discuss her lab results at her next visit unless there is a critical issue that needs to be addressed sooner. Mallory Charles agreed to keep her next visit at the agreed upon time to discuss these results.  Objective:   Blood pressure 110/71, pulse (!) 56, temperature 98.2 F (36.8 C), height 5\' 4"  (1.626 m), weight 277 lb (125.6 kg), SpO2 96 %. Body mass index is 47.55 kg/m.  EKG: Normal sinus rhythm, rate 60 BPM.  Indirect  Calorimeter completed today shows a VO2 of 331 and a REE of 2302.  Her calculated basal metabolic rate is thus her basal metabolic rate is better than expected.  General: Cooperative, alert, well developed, in no acute distress. HEENT: Conjunctivae and lids unremarkable. Cardiovascular: Regular rhythm.  Lungs: Normal work of breathing. Neurologic: No focal deficits.   Lab Results  Component Value Date   CREATININE 0.77 09/08/2020   BUN 13 09/08/2020   NA 145 (H) 09/08/2020   K 4.0 09/08/2020   CL 107 (H) 09/08/2020   CO2 19 (L) 09/08/2020   Lab Results  Component Value Date   ALT 19 09/08/2020   AST 24 09/08/2020   ALKPHOS 111 09/08/2020   BILITOT 0.5 09/08/2020   Lab Results  Component Value Date   HGBA1C 6.6 (H) 09/08/2020   Lab Results  Component Value Date   INSULIN 6.2 09/08/2020   Lab Results  Component Value Date   TSH 0.904 09/08/2020   Lab Results  Component Value Date   CHOL 155 09/08/2020   HDL 34 (L) 09/08/2020   LDLCALC 95 09/08/2020   TRIG 145 09/08/2020   Lab Results  Component Value Date   WBC 6.2 09/08/2020   HGB 14.8 09/08/2020   HCT 44.7 09/08/2020   MCV 93 09/08/2020   No results found for: IRON, TIBC, FERRITIN  Attestation Statements:   Reviewed by clinician on day of visit: allergies, medications, problem list, medical history, surgical history, family history, social history, and previous encounter notes.   I, 09/10/2020, am acting as transcriptionist for Burt Knack, MD.  I have reviewed the above documentation for accuracy and completeness, and I agree with the above. - Quillian Quince, MD

## 2020-09-22 ENCOUNTER — Ambulatory Visit (INDEPENDENT_AMBULATORY_CARE_PROVIDER_SITE_OTHER): Payer: BC Managed Care – PPO | Admitting: Family Medicine

## 2020-09-22 ENCOUNTER — Encounter (INDEPENDENT_AMBULATORY_CARE_PROVIDER_SITE_OTHER): Payer: Self-pay | Admitting: Family Medicine

## 2020-09-22 ENCOUNTER — Telehealth (INDEPENDENT_AMBULATORY_CARE_PROVIDER_SITE_OTHER): Payer: Self-pay

## 2020-09-22 ENCOUNTER — Other Ambulatory Visit: Payer: Self-pay

## 2020-09-22 VITALS — BP 115/78 | HR 55 | Temp 97.7°F | Ht 64.0 in | Wt 269.0 lb

## 2020-09-22 DIAGNOSIS — E1169 Type 2 diabetes mellitus with other specified complication: Secondary | ICD-10-CM

## 2020-09-22 DIAGNOSIS — E559 Vitamin D deficiency, unspecified: Secondary | ICD-10-CM | POA: Diagnosis not present

## 2020-09-22 DIAGNOSIS — E785 Hyperlipidemia, unspecified: Secondary | ICD-10-CM

## 2020-09-22 DIAGNOSIS — Z6841 Body Mass Index (BMI) 40.0 and over, adult: Secondary | ICD-10-CM

## 2020-09-22 MED ORDER — OZEMPIC (0.25 OR 0.5 MG/DOSE) 2 MG/1.5ML ~~LOC~~ SOPN: 0.5000 mg | PEN_INJECTOR | SUBCUTANEOUS | 0 refills | Status: DC

## 2020-09-22 NOTE — Telephone Encounter (Signed)
Pt called in and stated that today at check in her payment was taken 2 times. I looked into the account and only see one payment. I told her that if it is still no returned by Monday to please call the office.

## 2020-09-29 NOTE — Progress Notes (Signed)
Chief Complaint:   OBESITY Mallory Charles is here to discuss her progress with her obesity treatment plan along with follow-up of her obesity related diagnoses. Mallory Charles is on the Category 2 Plan and states she is following her eating plan approximately 80% of the time. Mallory Charles states she is doing 0 minutes 0 times per week.  Today's visit was #: 2 Starting weight: 277 lbs Starting date: 09/08/2020 Today's weight: 269 lbs Today's date: 09/22/2020 Total lbs lost to date: 8 Total lbs lost since last in-office visit: 8  Interim History: Mallory Charles has done very well with weight loss. Her hunger is controlled and she struggled to eat all of the food on her plan.  Subjective:   1. Hyperlipidemia associated with type 2 diabetes mellitus (HCC) Mallory Charles is not on statin, and her LDL is above 90, and she is doing very well with diet and exercise. I discussed labs with the patient today.  2. Vitamin D deficiency Mallory Charles is on VIT OTC 5,000 IU daily, and she is status post weight loss surgery. I discussed labs with the patient today.  3. Type 2 diabetes mellitus with other specified complication, without long-term current use of insulin (HCC) Mallory Charles is on metformin and she is working on diet, but she notes increased polyphagia especially in the PM even after eating a meal. I discussed labs with the patient today.  Assessment/Plan:   1. Hyperlipidemia associated with type 2 diabetes mellitus (HCC) Cardiovascular risk and specific lipid/LDL goals reviewed. We discussed several lifestyle modifications today. Mallory Charles will continue to work on diet, exercise and weight loss efforts. Orders and follow up as documented in patient record.   2. Vitamin D deficiency Low Vitamin D level contributes to fatigue and are associated with obesity, breast, and colon cancer. Mallory Charles agreed to continue taking OTC Vitamin D 5,000 IU daily and we will recheck labs in 3 months. She will follow-up for routine testing of Vitamin D, at least  2-3 times per year to avoid over-replacement.  3. Type 2 diabetes mellitus with other specified complication, without long-term current use of insulin (HCC) Mallory Charles agreed to start Ozempic 0.25 mg q weekly with no refills, and she will continue metformin as is. Good blood sugar control is important to decrease the likelihood of diabetic complications such as nephropathy, neuropathy, limb loss, blindness, coronary artery disease, and death. Intensive lifestyle modification including diet, exercise and weight loss are the first line of treatment for diabetes.   - Semaglutide,0.25 or 0.5MG /DOS, (OZEMPIC, 0.25 OR 0.5 MG/DOSE,) 2 MG/1.5ML SOPN; Inject 0.5 mg into the skin once a week.  Dispense: 1.5 mL; Refill: 0  4. Obesity with current BMI 46.2 Mallory Charles is currently in the action stage of change. As such, her goal is to continue with weight loss efforts. She has agreed to the Category 2 Plan.   Behavioral modification strategies: increasing lean protein intake.  Mallory Charles has agreed to follow-up with our clinic in 2 weeks. She was informed of the importance of frequent follow-up visits to maximize her success with intensive lifestyle modifications for her multiple health conditions.   Objective:   Blood pressure 115/78, pulse (!) 55, temperature 97.7 F (36.5 C), height 5\' 4"  (1.626 m), weight 269 lb (122 kg), SpO2 96 %. Body mass index is 46.17 kg/m.  General: Cooperative, alert, well developed, in no acute distress. HEENT: Conjunctivae and lids unremarkable. Cardiovascular: Regular rhythm.  Lungs: Normal work of breathing. Neurologic: No focal deficits.   Lab Results  Component Value  Date   CREATININE 0.77 09/08/2020   BUN 13 09/08/2020   NA 145 (H) 09/08/2020   K 4.0 09/08/2020   CL 107 (H) 09/08/2020   CO2 19 (L) 09/08/2020   Lab Results  Component Value Date   ALT 19 09/08/2020   AST 24 09/08/2020   ALKPHOS 111 09/08/2020   BILITOT 0.5 09/08/2020   Lab Results  Component Value  Date   HGBA1C 6.6 (H) 09/08/2020   Lab Results  Component Value Date   INSULIN 6.2 09/08/2020   Lab Results  Component Value Date   TSH 0.904 09/08/2020   Lab Results  Component Value Date   CHOL 155 09/08/2020   HDL 34 (L) 09/08/2020   LDLCALC 95 09/08/2020   TRIG 145 09/08/2020   Lab Results  Component Value Date   VD25OH 40.8 09/08/2020   Lab Results  Component Value Date   WBC 6.2 09/08/2020   HGB 14.8 09/08/2020   HCT 44.7 09/08/2020   MCV 93 09/08/2020   No results found for: IRON, TIBC, FERRITIN  Attestation Statements:   Reviewed by clinician on day of visit: allergies, medications, problem list, medical history, surgical history, family history, social history, and previous encounter notes.  Time spent on visit including pre-visit chart review and post-visit care and charting was 40 minutes.    I, Burt Knack, am acting as transcriptionist for Quillian Quince, MD.  I have reviewed the above documentation for accuracy and completeness, and I agree with the above. -  Quillian Quince, MD

## 2020-10-06 ENCOUNTER — Encounter (INDEPENDENT_AMBULATORY_CARE_PROVIDER_SITE_OTHER): Payer: Self-pay | Admitting: Family Medicine

## 2020-10-06 ENCOUNTER — Other Ambulatory Visit: Payer: Self-pay

## 2020-10-06 ENCOUNTER — Ambulatory Visit (INDEPENDENT_AMBULATORY_CARE_PROVIDER_SITE_OTHER): Payer: BC Managed Care – PPO | Admitting: Family Medicine

## 2020-10-06 VITALS — BP 109/70 | HR 66 | Temp 98.0°F | Ht 64.0 in | Wt 265.0 lb

## 2020-10-06 DIAGNOSIS — F3289 Other specified depressive episodes: Secondary | ICD-10-CM

## 2020-10-06 DIAGNOSIS — K5900 Constipation, unspecified: Secondary | ICD-10-CM | POA: Diagnosis not present

## 2020-10-06 DIAGNOSIS — Z6841 Body Mass Index (BMI) 40.0 and over, adult: Secondary | ICD-10-CM

## 2020-10-06 DIAGNOSIS — Z9189 Other specified personal risk factors, not elsewhere classified: Secondary | ICD-10-CM | POA: Diagnosis not present

## 2020-10-06 DIAGNOSIS — E1169 Type 2 diabetes mellitus with other specified complication: Secondary | ICD-10-CM

## 2020-10-06 MED ORDER — POLYETHYLENE GLYCOL 3350 17 GM/SCOOP PO POWD: ORAL | 0 refills | Status: AC

## 2020-10-06 MED ORDER — ESCITALOPRAM OXALATE 10 MG PO TABS: 10.0000 mg | ORAL_TABLET | Freq: Every day | ORAL | 0 refills | Status: DC

## 2020-10-12 NOTE — Progress Notes (Signed)
Chief Complaint:   OBESITY Mallory Charles is here to discuss her progress with her obesity treatment plan along with follow-up of her obesity related diagnoses. Mallory Charles is on the Category 2 Plan and states she is following her eating plan approximately 100% of the time. Mallory Charles states she is doing 0 minutes 0 times per week.  Today's visit was #: 3 Starting weight: 277 lbs Starting date: 09/08/2020 Today's weight: 265 lbs Today's date: 10/06/2020 Total lbs lost to date: 12 Total lbs lost since last in-office visit: 4  Interim History: Mallory Charles continues to do well with weight loss on her plan. She is getting bored with dinner and would like to discuss more options.  Subjective:   1. Constipation, unspecified constipation type Laporscha notes decreased BM frequency and BM harder and more painful.  2. Other depression Mallory Charles is stable on Lexapro. Her primary care physician is leaving and she needs a refill. She is looking for a new primary care physician.  3. At risk for impaired metabolic function Mallory Charles is at increased risk for impaired metabolic function due to current nutrition and muscle mass.   Assessment/Plan:   1. Constipation, unspecified constipation type Mallory Charles agreed to start miralax 17 grams daily with 8 oz of water. She was informed that a decrease in bowel movement frequency is normal while losing weight, but stools should not be hard or painful. Orders and follow up as documented in patient record.   - polyethylene glycol powder (GLYCOLAX/MIRALAX) 17 GM/SCOOP powder; Take once a day with 8oz of water.  Dispense: 3350 g; Refill: 0  2. Other depression Behavior modification techniques were discussed today to help Mallory Charles deal with her emotional/non-hunger eating behaviors. We will refill Lexapro for 1 month. Orders and follow up as documented in patient record.   - escitalopram (LEXAPRO) 10 MG tablet; Take 1 tablet (10 mg total) by mouth daily. For Depression  Dispense: 30 tablet;  Refill: 0  3. At risk for impaired metabolic function Mallory Charles was given approximately 15 minutes of impaired  metabolic function prevention counseling today. We discussed intensive lifestyle modifications today with an emphasis on specific nutrition and exercise instructions and strategies.   Repetitive spaced learning was employed today to elicit superior memory formation and behavioral change.  4. Obesity with current BMI 45.6 Mallory Charles is currently in the action stage of change. As such, her goal is to continue with weight loss efforts. She has agreed to the Category 2 Plan.   Mallory Charles was showed how to journal, and handouts were given today.  Behavioral modification strategies: increasing lean protein intake and keeping a strict food journal.  Mallory Charles has agreed to follow-up with our clinic in 2 weeks. She was informed of the importance of frequent follow-up visits to maximize her success with intensive lifestyle modifications for her multiple health conditions.   Objective:   Blood pressure 109/70, pulse 66, temperature 98 F (36.7 C), height 5\' 4"  (1.626 m), weight 265 lb (120.2 kg), SpO2 98 %. Body mass index is 45.49 kg/m.  General: Cooperative, alert, well developed, in no acute distress. HEENT: Conjunctivae and lids unremarkable. Cardiovascular: Regular rhythm.  Lungs: Normal work of breathing. Neurologic: No focal deficits.   Lab Results  Component Value Date   CREATININE 0.77 09/08/2020   BUN 13 09/08/2020   NA 145 (H) 09/08/2020   K 4.0 09/08/2020   CL 107 (H) 09/08/2020   CO2 19 (L) 09/08/2020   Lab Results  Component Value Date   ALT 19  09/08/2020   AST 24 09/08/2020   ALKPHOS 111 09/08/2020   BILITOT 0.5 09/08/2020   Lab Results  Component Value Date   HGBA1C 6.6 (H) 09/08/2020   Lab Results  Component Value Date   INSULIN 6.2 09/08/2020   Lab Results  Component Value Date   TSH 0.904 09/08/2020   Lab Results  Component Value Date   CHOL 155 09/08/2020    HDL 34 (L) 09/08/2020   LDLCALC 95 09/08/2020   TRIG 145 09/08/2020   Lab Results  Component Value Date   VD25OH 40.8 09/08/2020   Lab Results  Component Value Date   WBC 6.2 09/08/2020   HGB 14.8 09/08/2020   HCT 44.7 09/08/2020   MCV 93 09/08/2020   No results found for: IRON, TIBC, FERRITIN  Attestation Statements:   Reviewed by clinician on day of visit: allergies, medications, problem list, medical history, surgical history, family history, social history, and previous encounter notes.   I, Burt Knack, am acting as transcriptionist for Quillian Quince, MD.  I have reviewed the above documentation for accuracy and completeness, and I agree with the above. -  Quillian Quince, MD

## 2020-10-20 ENCOUNTER — Ambulatory Visit (INDEPENDENT_AMBULATORY_CARE_PROVIDER_SITE_OTHER): Payer: BC Managed Care – PPO | Admitting: Adult Health

## 2020-10-20 ENCOUNTER — Encounter (INDEPENDENT_AMBULATORY_CARE_PROVIDER_SITE_OTHER): Payer: Self-pay | Admitting: Adult Health

## 2020-10-20 ENCOUNTER — Other Ambulatory Visit: Payer: Self-pay

## 2020-10-20 VITALS — BP 104/68 | HR 64 | Temp 98.0°F | Ht 64.0 in | Wt 262.0 lb

## 2020-10-20 DIAGNOSIS — F3289 Other specified depressive episodes: Secondary | ICD-10-CM | POA: Diagnosis not present

## 2020-10-20 DIAGNOSIS — Z6841 Body Mass Index (BMI) 40.0 and over, adult: Secondary | ICD-10-CM

## 2020-10-20 DIAGNOSIS — K5900 Constipation, unspecified: Secondary | ICD-10-CM | POA: Diagnosis not present

## 2020-10-20 DIAGNOSIS — E1169 Type 2 diabetes mellitus with other specified complication: Secondary | ICD-10-CM

## 2020-10-20 DIAGNOSIS — Z9189 Other specified personal risk factors, not elsewhere classified: Secondary | ICD-10-CM | POA: Diagnosis not present

## 2020-10-20 DIAGNOSIS — F32A Depression, unspecified: Secondary | ICD-10-CM | POA: Insufficient documentation

## 2020-10-20 DIAGNOSIS — E119 Type 2 diabetes mellitus without complications: Secondary | ICD-10-CM | POA: Insufficient documentation

## 2020-10-20 DIAGNOSIS — E66813 Obesity, class 3: Secondary | ICD-10-CM | POA: Insufficient documentation

## 2020-10-20 MED ORDER — OZEMPIC (0.25 OR 0.5 MG/DOSE) 2 MG/1.5ML ~~LOC~~ SOPN: 0.5000 mg | PEN_INJECTOR | SUBCUTANEOUS | 0 refills | Status: DC

## 2020-10-20 NOTE — Progress Notes (Signed)
Chief Complaint:   OBESITY Mallory Charles is here to discuss her progress with her obesity treatment plan along with follow-up of her obesity related diagnoses. Mallory Charles is on the Category 2 Plan and states she is following her eating plan approximately 95% of the time. Mallory Charles states she is not exercising regularly.  Today's visit was #: 4 Starting weight: 277 lbs Starting date: 09/08/2020 Today's weight: 262 lbs Today's date: 10/20/2020 Total lbs lost to date: 15 lbs Total lbs lost since last in-office visit: 3 lbs  Interim History:  Mallory Charles started OTC MiraLAX, and constipation has resolved.  She continues to enjoy the foods and structure of the Category 2 meal plan.  Subjective:   1. Constipation, unspecified constipation type Mallory Charles has started OTC MiraLAX and is now having 1-2 BM/day.  She denies abdominal pain, experiences occasional bright red blood with defecation (likely due to passage of hardened stool).  Colonoscopy UTD.  No family history of colon cancer.  2. Type 2 diabetes mellitus with other specified complication, without long-term current use of insulin (HCC) On 09/08/2020, A1c was 6.9.  ambulatory fasting BG 100-130s.  She is on Ozempic 0.5 mg once weekly.  Has had 4 doses at this strength.  Lab Results  Component Value Date   HGBA1C 6.6 (H) 09/08/2020   Lab Results  Component Value Date   LDLCALC 95 09/08/2020   CREATININE 0.77 09/08/2020   Lab Results  Component Value Date   INSULIN 6.2 09/08/2020   3. Other depression She is on escitalopram 10 mg daily - reports stable mood.  She denies SI/HI.  She reports history of obsession with food - agreeable to referral to Dr. Dewaine Charles.  4. At risk for nausea Mallory Charles is at risk for nausea due to taking a GLP-1 for T2D.  Assessment/Plan:   1. Constipation, unspecified constipation type Mallory Charles was informed that a decrease in bowel movement frequency is normal while losing weight, but stools should not be hard or painful. Orders  and follow up as documented in patient record.  Increase water.  Continue OTC MiraLAX.  Counseling Getting to Good Bowel Health: Your goal is to have one soft bowel movement each day. Drink at least 8 glasses of water each day. Eat plenty of fiber (goal is over 25 grams each day). It is best to get most of your fiber from dietary sources which includes leafy green vegetables, fresh fruit, and whole grains. You Mallory Charles need to add fiber with the help of OTC fiber supplements. These include Metamucil, Citrucel, and Flaxseed. If you are still having trouble, try adding Miralax or Magnesium Citrate. If all of these changes do not work, Dietitian.   2. Type 2 diabetes mellitus with other specified complication, without long-term current use of insulin (HCC) Good blood sugar control is important to decrease the likelihood of diabetic complications such as nephropathy, neuropathy, limb loss, blindness, coronary artery disease, and death. Intensive lifestyle modification including diet, exercise and weight loss are the first line of treatment for diabetes.  Refill Ozempic 0.5 mg once weekly.  - Refill Semaglutide,0.25 or 0.5MG /DOS, (OZEMPIC, 0.25 OR 0.5 MG/DOSE,) 2 MG/1.5ML SOPN; Inject 0.5 mg into the skin once a week.  Dispense: 1.5 mL; Refill: 0  3. Other depression Patient was referred to Dr. Dewaine Charles, our Bariatric Psychologist, for evaluation due to her elevated PHQ-9 score and significant struggles with emotional eating.   4. At risk for nausea Mallory Charles was given approximately 15 minutes of nausea prevention  counseling today. Mallory Charles is at risk for nausea due to her new or current medication. She was encouraged to titrate her medication slowly, make sure to stay hydrated, eat smaller portions throughout the day, and avoid high fat meals.    5. Obesity with current BMI 45.0  Mallory Charles is currently in the action stage of change. As such, her goal is to continue with weight loss efforts. She has  agreed to the Category 2 Plan.   Exercise goals: No exercise has been prescribed at this time.  Behavioral modification strategies: increasing lean protein intake, decreasing simple carbohydrates, meal planning and cooking strategies, keeping healthy foods in the home, and planning for success.  Establish with PCP. Change timing of lunch and dinner - slowly eat dinner throughout the day.  Mallory Charles has agreed to follow-up with our clinic in 2 weeks. She was informed of the importance of frequent follow-up visits to maximize her success with intensive lifestyle modifications for her multiple health conditions.   Objective:   Blood pressure 104/68, pulse 64, temperature 98 F (36.7 C), height 5\' 4"  (1.626 m), weight 262 lb (118.8 kg), SpO2 97 %. Body mass index is 44.97 kg/m.  General: Cooperative, alert, well developed, in no acute distress. HEENT: Conjunctivae and lids unremarkable. Cardiovascular: Regular rhythm.  Lungs: Normal work of breathing. Neurologic: No focal deficits.   Lab Results  Component Value Date   CREATININE 0.77 09/08/2020   BUN 13 09/08/2020   NA 145 (H) 09/08/2020   K 4.0 09/08/2020   CL 107 (H) 09/08/2020   CO2 19 (L) 09/08/2020   Lab Results  Component Value Date   ALT 19 09/08/2020   AST 24 09/08/2020   ALKPHOS 111 09/08/2020   BILITOT 0.5 09/08/2020   Lab Results  Component Value Date   HGBA1C 6.6 (H) 09/08/2020   Lab Results  Component Value Date   INSULIN 6.2 09/08/2020   Lab Results  Component Value Date   TSH 0.904 09/08/2020   Lab Results  Component Value Date   CHOL 155 09/08/2020   HDL 34 (L) 09/08/2020   LDLCALC 95 09/08/2020   TRIG 145 09/08/2020   Lab Results  Component Value Date   VD25OH 40.8 09/08/2020   Lab Results  Component Value Date   WBC 6.2 09/08/2020   HGB 14.8 09/08/2020   HCT 44.7 09/08/2020   MCV 93 09/08/2020   Attestation Statements:   Reviewed by clinician on day of visit: allergies, medications,  problem list, medical history, surgical history, family history, social history, and previous encounter notes.  I, 09/10/2020, CMA, am acting as Insurance claims handler for Energy manager, NP.  I have reviewed the above documentation for accuracy and completeness, and I agree with the above. -  Mallory Charles d. Mallory Gervin, NP-C

## 2020-10-25 NOTE — Addendum Note (Signed)
Addended by: Maryan Puls on: 10/25/2020 12:16 PM   Modules accepted: Orders

## 2020-11-02 ENCOUNTER — Telehealth (INDEPENDENT_AMBULATORY_CARE_PROVIDER_SITE_OTHER): Payer: BC Managed Care – PPO | Admitting: Psychology

## 2020-11-03 ENCOUNTER — Ambulatory Visit (INDEPENDENT_AMBULATORY_CARE_PROVIDER_SITE_OTHER): Payer: BC Managed Care – PPO | Admitting: Family Medicine

## 2020-11-03 ENCOUNTER — Other Ambulatory Visit: Payer: Self-pay

## 2020-11-03 ENCOUNTER — Encounter (INDEPENDENT_AMBULATORY_CARE_PROVIDER_SITE_OTHER): Payer: Self-pay | Admitting: Family Medicine

## 2020-11-03 VITALS — BP 104/72 | HR 75 | Temp 97.6°F | Ht 64.0 in | Wt 257.0 lb

## 2020-11-03 DIAGNOSIS — F3289 Other specified depressive episodes: Secondary | ICD-10-CM

## 2020-11-03 DIAGNOSIS — Z9189 Other specified personal risk factors, not elsewhere classified: Secondary | ICD-10-CM | POA: Diagnosis not present

## 2020-11-03 DIAGNOSIS — E1169 Type 2 diabetes mellitus with other specified complication: Secondary | ICD-10-CM | POA: Diagnosis not present

## 2020-11-03 DIAGNOSIS — Z6841 Body Mass Index (BMI) 40.0 and over, adult: Secondary | ICD-10-CM

## 2020-11-03 DIAGNOSIS — E66813 Obesity, class 3: Secondary | ICD-10-CM

## 2020-11-03 MED ORDER — ESCITALOPRAM OXALATE 10 MG PO TABS: 10.0000 mg | ORAL_TABLET | Freq: Every day | ORAL | 0 refills | Status: DC

## 2020-11-03 MED ORDER — OZEMPIC (0.25 OR 0.5 MG/DOSE) 2 MG/1.5ML ~~LOC~~ SOPN: 0.5000 mg | PEN_INJECTOR | SUBCUTANEOUS | 0 refills | Status: DC

## 2020-11-04 NOTE — Progress Notes (Signed)
Chief Complaint:   OBESITY Mallory Charles is here to discuss her progress with her obesity treatment plan along with follow-up of her obesity related diagnoses. Mallory Charles is on the Category 2 Plan and states she is following her eating plan approximately 95% of the time. Mallory Charles states she is doing 0 minutes 0 times per week.  Today's visit was #: 5 Starting weight: 277 lbs Starting date: 09/08/2020 Today's weight: 257 lbs Today's date: 11/03/2020 Total lbs lost to date: 20 Total lbs lost since last in-office visit: 5  Interim History: Mallory Charles continues to do well with weight loss on her plan. She is getting bored however with lunch, and she is open to looking at other options.  Subjective:   1. Type 2 diabetes mellitus with other specified complication, without long-term current use of insulin (HCC) Mallory Charles is stable on her medications, and she is doing well with Ozempic. She notes minimal episodes of nausea.  2. Other depression with emotional eating Mallory Charles is stable on Lexapro, and her blood pressure is not elevated. She is doing well decreasing emotional eating behaviors.  3. At risk for heart disease Mallory Charles is at a higher than average risk for cardiovascular disease due to obesity.   Assessment/Plan:   1. Type 2 diabetes mellitus with other specified complication, without long-term current use of insulin (HCC) Mallory Charles will continue Ozempic 0.5 mg and we will refill for 1 month. Good blood sugar control is important to decrease the likelihood of diabetic complications such as nephropathy, neuropathy, limb loss, blindness, coronary artery disease, and death. Intensive lifestyle modification including diet, exercise and weight loss are the first line of treatment for diabetes.   - Semaglutide,0.25 or 0.5MG /DOS, (OZEMPIC, 0.25 OR 0.5 MG/DOSE,) 2 MG/1.5ML SOPN; Inject 0.5 mg into the skin once a week.  Dispense: 1.5 mL; Refill: 0  2. Other depression with emotional eating Behavior modification  techniques were discussed today to help Mallory Charles deal with her emotional/non-hunger eating behaviors. We will refill Lexapro for 1 month, and Mallory Charles will continue Prozac as is. Orders and follow up as documented in patient record.   - escitalopram (LEXAPRO) 10 MG tablet; Take 1 tablet (10 mg total) by mouth daily. For Depression  Dispense: 30 tablet; Refill: 0  3. At risk for heart disease Mallory Charles was given approximately 15 minutes of coronary artery disease prevention counseling today. She is 65 y.o. female and has risk factors for heart disease including obesity. We discussed intensive lifestyle modifications today with an emphasis on specific weight loss instructions and strategies.   Repetitive spaced learning was employed today to elicit superior memory formation and behavioral change.  4. Obesity with current BMI 44.3 Mallory Charles is currently in the action stage of change. As such, her goal is to continue with weight loss efforts. She has agreed to the Category 2 Plan with lunch options.   Behavioral modification strategies: increasing lean protein intake and emotional eating strategies.  Mallory Charles has agreed to follow-up with our clinic in 2 to 3 weeks. She was informed of the importance of frequent follow-up visits to maximize her success with intensive lifestyle modifications for her multiple health conditions.   Objective:   Blood pressure 104/72, pulse 75, temperature 97.6 F (36.4 C), height 5\' 4"  (1.626 m), weight 257 lb (116.6 kg), SpO2 97 %. Body mass index is 44.11 kg/m.  General: Cooperative, alert, well developed, in no acute distress. HEENT: Conjunctivae and lids unremarkable. Cardiovascular: Regular rhythm.  Lungs: Normal work of breathing. Neurologic: No  focal deficits.   Lab Results  Component Value Date   CREATININE 0.77 09/08/2020   BUN 13 09/08/2020   NA 145 (H) 09/08/2020   K 4.0 09/08/2020   CL 107 (H) 09/08/2020   CO2 19 (L) 09/08/2020   Lab Results  Component Value  Date   ALT 19 09/08/2020   AST 24 09/08/2020   ALKPHOS 111 09/08/2020   BILITOT 0.5 09/08/2020   Lab Results  Component Value Date   HGBA1C 6.6 (H) 09/08/2020   Lab Results  Component Value Date   INSULIN 6.2 09/08/2020   Lab Results  Component Value Date   TSH 0.904 09/08/2020   Lab Results  Component Value Date   CHOL 155 09/08/2020   HDL 34 (L) 09/08/2020   LDLCALC 95 09/08/2020   TRIG 145 09/08/2020   Lab Results  Component Value Date   VD25OH 40.8 09/08/2020   Lab Results  Component Value Date   WBC 6.2 09/08/2020   HGB 14.8 09/08/2020   HCT 44.7 09/08/2020   MCV 93 09/08/2020   No results found for: IRON, TIBC, FERRITIN  Attestation Statements:   Reviewed by clinician on day of visit: allergies, medications, problem list, medical history, surgical history, family history, social history, and previous encounter notes.   I, Burt Knack, am acting as transcriptionist for Quillian Quince, MD.  I have reviewed the above documentation for accuracy and completeness, and I agree with the above. -  Quillian Quince, MD

## 2020-11-16 ENCOUNTER — Ambulatory Visit (INDEPENDENT_AMBULATORY_CARE_PROVIDER_SITE_OTHER): Payer: BC Managed Care – PPO | Admitting: Family Medicine

## 2020-11-29 ENCOUNTER — Encounter (INDEPENDENT_AMBULATORY_CARE_PROVIDER_SITE_OTHER): Payer: Self-pay | Admitting: Family Medicine

## 2020-11-29 ENCOUNTER — Other Ambulatory Visit: Payer: Self-pay

## 2020-11-29 ENCOUNTER — Ambulatory Visit (INDEPENDENT_AMBULATORY_CARE_PROVIDER_SITE_OTHER): Payer: BC Managed Care – PPO | Admitting: Family Medicine

## 2020-11-29 VITALS — BP 96/62 | HR 78 | Temp 98.2°F | Ht 64.0 in | Wt 261.0 lb

## 2020-11-29 DIAGNOSIS — F3289 Other specified depressive episodes: Secondary | ICD-10-CM | POA: Diagnosis not present

## 2020-11-29 DIAGNOSIS — Z6841 Body Mass Index (BMI) 40.0 and over, adult: Secondary | ICD-10-CM | POA: Diagnosis not present

## 2020-11-29 DIAGNOSIS — E1169 Type 2 diabetes mellitus with other specified complication: Secondary | ICD-10-CM | POA: Diagnosis not present

## 2020-11-29 DIAGNOSIS — Z9189 Other specified personal risk factors, not elsewhere classified: Secondary | ICD-10-CM

## 2020-11-29 MED ORDER — ESCITALOPRAM OXALATE 10 MG PO TABS: 10.0000 mg | ORAL_TABLET | Freq: Every day | ORAL | 0 refills | Status: DC

## 2020-11-29 MED ORDER — BUPROPION HCL ER (SR) 150 MG PO TB12
150.0000 mg | ORAL_TABLET | Freq: Every day | ORAL | 0 refills | Status: DC
Start: 2020-11-29 — End: 2020-12-29

## 2020-11-29 MED ORDER — OZEMPIC (0.25 OR 0.5 MG/DOSE) 2 MG/1.5ML ~~LOC~~ SOPN: 0.5000 mg | PEN_INJECTOR | SUBCUTANEOUS | 0 refills | Status: DC

## 2020-11-29 NOTE — Progress Notes (Signed)
Chief Complaint:   OBESITY Mallory Charles is here to discuss her progress with her obesity treatment plan along with follow-up of her obesity related diagnoses. Mallory Charles is on the Category 2 Plan with lunch options and states she is following her eating plan approximately 50% of the time. Mallory Charles states she is doing 0 minutes 0 times per week.  Today's visit was #: 6 Starting weight: 277 lbs Starting date: 09/08/2020 Today's weight: 261 lbs Today's date: 11/29/2020 Total lbs lost to date: 16 Total lbs lost since last in-office visit: 0  Interim History: Mallory Charles did more celebration eating recently. She is ready to get back on track, but she will be doing more with her grandkids soon which might have extra challenges. She is status post duodenal switch weight loss surgery.  Subjective:   1. Type 2 diabetes mellitus with other specified complication, without long-term current use of insulin (HCC) Mallory Charles is stable on Ozempic, and she notes decreased hunger but some increase in burping.  2. Other depression with emotional eating Mallory Charles notes feeling deprived and she has had increased cravings. She is on Lexapro and she is tolerating it well.  3. At risk for nausea Mallory Charles is at risk for nausea due to Ozempic.  Assessment/Plan:   1. Type 2 diabetes mellitus with other specified complication, without long-term current use of insulin (HCC) We will refill Ozempic 0.5 mg q weekly for 1 month. Mallory Charles is to be mindful of her portions and volume, and to not push to finish her food at 1 time. Good blood sugar control is important to decrease the likelihood of diabetic complications such as nephropathy, neuropathy, limb loss, blindness, coronary artery disease, and death. Intensive lifestyle modification including diet, exercise and weight loss are the first line of treatment for diabetes.   2. Other depression with emotional eating Behavior modification techniques were discussed today to help Mallory Charles deal with her  emotional/non-hunger eating behaviors. Mallory Charles will continue Lexapro 10 mg q daily #30 and we will refill for 1 month, and she agreed to start Wellbutrin SR 150 mg q AM #30 with no refills. Orders and follow up as documented in patient record.   3. At risk for nausea Mallory Charles was given approximately 15 minutes of nausea prevention counseling today. Mallory Charles is at risk for nausea due to her new or current medication. She was encouraged to titrate her medication slowly, make sure to stay hydrated, try to divide meals to keep volume down, and avoid high fat meals.   4. Obesity with current BMI of 44.8 Mallory Charles is currently in the action stage of change. As such, her goal is to continue with weight loss efforts. She has agreed to the Category 2 Plan.   Behavioral modification strategies: dealing with family or coworker sabotage.  Mallory Charles has agreed to follow-up with our clinic in 2 weeks. She was informed of the importance of frequent follow-up visits to maximize her success with intensive lifestyle modifications for her multiple health conditions.   Objective:   Blood pressure 96/62, pulse 78, temperature 98.2 F (36.8 C), height 5\' 4"  (1.626 m), weight 261 lb (118.4 kg), SpO2 96 %. Body mass index is 44.8 kg/m.  General: Cooperative, alert, well developed, in no acute distress. HEENT: Conjunctivae and lids unremarkable. Cardiovascular: Regular rhythm.  Lungs: Normal work of breathing. Neurologic: No focal deficits.   Lab Results  Component Value Date   CREATININE 0.77 09/08/2020   BUN 13 09/08/2020   NA 145 (H) 09/08/2020  K 4.0 09/08/2020   CL 107 (H) 09/08/2020   CO2 19 (L) 09/08/2020   Lab Results  Component Value Date   ALT 19 09/08/2020   AST 24 09/08/2020   ALKPHOS 111 09/08/2020   BILITOT 0.5 09/08/2020   Lab Results  Component Value Date   HGBA1C 6.6 (H) 09/08/2020   Lab Results  Component Value Date   INSULIN 6.2 09/08/2020   Lab Results  Component Value Date    TSH 0.904 09/08/2020   Lab Results  Component Value Date   CHOL 155 09/08/2020   HDL 34 (L) 09/08/2020   LDLCALC 95 09/08/2020   TRIG 145 09/08/2020   Lab Results  Component Value Date   VD25OH 40.8 09/08/2020   Lab Results  Component Value Date   WBC 6.2 09/08/2020   HGB 14.8 09/08/2020   HCT 44.7 09/08/2020   MCV 93 09/08/2020   No results found for: IRON, TIBC, FERRITIN  Attestation Statements:   Reviewed by clinician on day of visit: allergies, medications, problem list, medical history, surgical history, family history, social history, and previous encounter notes.   I, Burt Knack, am acting as transcriptionist for Quillian Quince, MD.  I have reviewed the above documentation for accuracy and completeness, and I agree with the above. -  Quillian Quince, MD

## 2020-12-15 ENCOUNTER — Ambulatory Visit (INDEPENDENT_AMBULATORY_CARE_PROVIDER_SITE_OTHER): Payer: BC Managed Care – PPO | Admitting: Family Medicine

## 2020-12-29 ENCOUNTER — Other Ambulatory Visit: Payer: Self-pay

## 2020-12-29 ENCOUNTER — Encounter (INDEPENDENT_AMBULATORY_CARE_PROVIDER_SITE_OTHER): Payer: Self-pay | Admitting: Family Medicine

## 2020-12-29 ENCOUNTER — Ambulatory Visit (INDEPENDENT_AMBULATORY_CARE_PROVIDER_SITE_OTHER): Payer: BC Managed Care – PPO | Admitting: Family Medicine

## 2020-12-29 VITALS — BP 106/70 | HR 64 | Temp 97.6°F | Ht 64.0 in | Wt 258.0 lb

## 2020-12-29 DIAGNOSIS — F3289 Other specified depressive episodes: Secondary | ICD-10-CM | POA: Diagnosis not present

## 2020-12-29 DIAGNOSIS — E1169 Type 2 diabetes mellitus with other specified complication: Secondary | ICD-10-CM

## 2020-12-29 DIAGNOSIS — Z6841 Body Mass Index (BMI) 40.0 and over, adult: Secondary | ICD-10-CM

## 2020-12-29 DIAGNOSIS — Z9189 Other specified personal risk factors, not elsewhere classified: Secondary | ICD-10-CM | POA: Diagnosis not present

## 2020-12-29 MED ORDER — SEMAGLUTIDE (1 MG/DOSE) 4 MG/3ML ~~LOC~~ SOPN: 1.0000 mg | PEN_INJECTOR | SUBCUTANEOUS | 0 refills | Status: DC

## 2020-12-29 MED ORDER — ESCITALOPRAM OXALATE 10 MG PO TABS: 10.0000 mg | ORAL_TABLET | Freq: Every day | ORAL | 0 refills | Status: DC

## 2020-12-29 MED ORDER — BUPROPION HCL ER (SR) 150 MG PO TB12: 150.0000 mg | ORAL_TABLET | Freq: Every day | ORAL | 0 refills | Status: DC

## 2020-12-30 NOTE — Progress Notes (Signed)
Chief Complaint:   OBESITY Mallory Charles is here to discuss her progress with her obesity treatment plan along with follow-up of her obesity related diagnoses. Mallory Charles is on the Category 2 Plan and states she is following her eating plan approximately 50% of the time. Mallory Charles states she is doing 0 minutes 0 times per week.  Today's visit was #: 7 Starting weight: 277 lbs Starting date: 09/08/2020 Today's weight: 258 lbs Today's date: 12/29/2020 Total lbs lost to date: 19 Total lbs lost since last in-office visit: 3  Interim History: Mallory Charles continues to do well with weight loss. She has done some comfort eating, and she struggled with meal planning more due to helping with her niece's wedding.  Subjective:   1. Type 2 diabetes mellitus with other specified complication, without long-term current use of insulin (HCC) Mallory Charles is stable on Ozempic and she denies nausea or vomiting, but she is still struggling with polyphagia.  2. Other depression with emotional eating Mallory Charles is dealing with multiple stressors and she has struggled with more comfort eating and feeling deprived.  3. At risk for heart disease Mallory Charles is at a higher than average risk for cardiovascular disease due to obesity.   Assessment/Plan:   1. Type 2 diabetes mellitus with other specified complication, without long-term current use of insulin (HCC) Mallory Charles agreed to increase Ozempic to 1 mg q weekly with no refills, and we will follow up on her progress in 2-3 weeks. Good blood sugar control is important to decrease the likelihood of diabetic complications such as nephropathy, neuropathy, limb loss, blindness, coronary artery disease, and death. Intensive lifestyle modification including diet, exercise and weight loss are the first line of treatment for diabetes.   - Semaglutide, 1 MG/DOSE, 4 MG/3ML SOPN; Inject 1 mg as directed once a week.  Dispense: 3 mL; Refill: 0  2. Other depression with emotional eating We will refill  Lexapro and Wellbutrin SR for 1 month. Emotional eating behavior strategies were discussed, using cognitive behavioral techniques and encouragement to help Mallory Charles deal with her emotional/non-hunger eating behaviors. Will continue to follow closely. Orders and follow up as documented in patient record.   - buPROPion (WELLBUTRIN SR) 150 MG 12 hr tablet; Take 1 tablet (150 mg total) by mouth daily.  Dispense: 30 tablet; Refill: 0 - escitalopram (LEXAPRO) 10 MG tablet; Take 1 tablet (10 mg total) by mouth daily. For Depression  Dispense: 30 tablet; Refill: 0  3. At risk for heart disease Mallory Charles was given approximately 15 minutes of coronary artery disease prevention counseling today. She is 65 y.o. female and has risk factors for heart disease including obesity. We discussed intensive lifestyle modifications today with an emphasis on specific weight loss instructions and strategies.   Repetitive spaced learning was employed today to elicit superior memory formation and behavioral change.  4. Obesity with current BMI of 44.4 Mallory Charles is currently in the action stage of change. As such, her goal is to continue with weight loss efforts. She has agreed to the Category 2 Plan.   Mallory Charles is to make sure to grocery shop and prioritize her own needs to help improve her health.  Behavioral modification strategies: increasing lean protein intake, meal planning and cooking strategies, and emotional eating strategies.  Mallory Charles has agreed to follow-up with our clinic in 2 to 3 weeks. She was informed of the importance of frequent follow-up visits to maximize her success with intensive lifestyle modifications for her multiple health conditions.   Objective:  Blood pressure 106/70, pulse 64, temperature 97.6 F (36.4 C), height 5\' 4"  (1.626 m), weight 258 lb (117 kg), SpO2 98 %. Body mass index is 44.29 kg/m.  General: Cooperative, alert, well developed, in no acute distress. HEENT: Conjunctivae and lids  unremarkable. Cardiovascular: Regular rhythm.  Lungs: Normal work of breathing. Neurologic: No focal deficits.   Lab Results  Component Value Date   CREATININE 0.77 09/08/2020   BUN 13 09/08/2020   NA 145 (H) 09/08/2020   K 4.0 09/08/2020   CL 107 (H) 09/08/2020   CO2 19 (L) 09/08/2020   Lab Results  Component Value Date   ALT 19 09/08/2020   AST 24 09/08/2020   ALKPHOS 111 09/08/2020   BILITOT 0.5 09/08/2020   Lab Results  Component Value Date   HGBA1C 6.6 (H) 09/08/2020   Lab Results  Component Value Date   INSULIN 6.2 09/08/2020   Lab Results  Component Value Date   TSH 0.904 09/08/2020   Lab Results  Component Value Date   CHOL 155 09/08/2020   HDL 34 (L) 09/08/2020   LDLCALC 95 09/08/2020   TRIG 145 09/08/2020   Lab Results  Component Value Date   VD25OH 40.8 09/08/2020   Lab Results  Component Value Date   WBC 6.2 09/08/2020   HGB 14.8 09/08/2020   HCT 44.7 09/08/2020   MCV 93 09/08/2020   No results found for: IRON, TIBC, FERRITIN  Attestation Statements:   Reviewed by clinician on day of visit: allergies, medications, problem list, medical history, surgical history, family history, social history, and previous encounter notes.   I, 09/10/2020, am acting as transcriptionist for Burt Knack, MD.  I have reviewed the above documentation for accuracy and completeness, and I agree with the above. -  Quillian Quince, MD

## 2021-01-04 ENCOUNTER — Other Ambulatory Visit (INDEPENDENT_AMBULATORY_CARE_PROVIDER_SITE_OTHER): Payer: Self-pay

## 2021-01-04 DIAGNOSIS — F3289 Other specified depressive episodes: Secondary | ICD-10-CM

## 2021-01-11 ENCOUNTER — Ambulatory Visit (INDEPENDENT_AMBULATORY_CARE_PROVIDER_SITE_OTHER): Payer: BC Managed Care – PPO | Admitting: Family Medicine

## 2021-01-11 ENCOUNTER — Telehealth (INDEPENDENT_AMBULATORY_CARE_PROVIDER_SITE_OTHER): Payer: BC Managed Care – PPO | Admitting: Psychology

## 2021-01-25 ENCOUNTER — Ambulatory Visit (INDEPENDENT_AMBULATORY_CARE_PROVIDER_SITE_OTHER): Payer: BC Managed Care – PPO | Admitting: Family Medicine

## 2021-01-25 ENCOUNTER — Other Ambulatory Visit: Payer: Self-pay

## 2021-01-25 ENCOUNTER — Encounter (INDEPENDENT_AMBULATORY_CARE_PROVIDER_SITE_OTHER): Payer: Self-pay | Admitting: Family Medicine

## 2021-01-25 VITALS — BP 121/73 | HR 67 | Temp 97.7°F | Ht 64.0 in | Wt 260.0 lb

## 2021-01-25 DIAGNOSIS — E1169 Type 2 diabetes mellitus with other specified complication: Secondary | ICD-10-CM

## 2021-01-25 DIAGNOSIS — Z6841 Body Mass Index (BMI) 40.0 and over, adult: Secondary | ICD-10-CM

## 2021-01-25 DIAGNOSIS — Z9189 Other specified personal risk factors, not elsewhere classified: Secondary | ICD-10-CM | POA: Diagnosis not present

## 2021-01-25 DIAGNOSIS — F3289 Other specified depressive episodes: Secondary | ICD-10-CM

## 2021-01-26 MED ORDER — SEMAGLUTIDE (2 MG/DOSE) 8 MG/3ML ~~LOC~~ SOPN: 2.0000 mg | PEN_INJECTOR | SUBCUTANEOUS | 0 refills | Status: DC

## 2021-01-26 MED ORDER — BUPROPION HCL ER (SR) 150 MG PO TB12: 150.0000 mg | ORAL_TABLET | Freq: Every day | ORAL | 0 refills | Status: DC

## 2021-01-26 MED ORDER — ESCITALOPRAM OXALATE 10 MG PO TABS: 10.0000 mg | ORAL_TABLET | Freq: Every day | ORAL | 0 refills | Status: DC

## 2021-01-26 NOTE — Progress Notes (Signed)
Chief Complaint:   OBESITY Mallory Charles is here to discuss her progress with her obesity treatment plan along with follow-up of her obesity related diagnoses. Mallory Charles is on the Category 2 Plan and states she is following her eating plan approximately 20% of the time. Mallory Charles states she is doing 0 minutes 0 times per week.  Today's visit was #: 8 Starting weight: 277 lbs Starting date: 09/08/2020 Today's weight: 260 lbs Today's date: 01/25/2021 Total lbs lost to date: 17 Total lbs lost since last in-office visit: 0  Interim History: Mallory Charles is up 2 lbs since her last visit, but this may be due to increased water weight. She is struggling with meal planning especially for dinner, and she is doing more grab and go eating.  Subjective:   1. Type 2 diabetes mellitus with other specified complication, without long-term current use of insulin (HCC) Mallory Charles continues to work on diet and exercise, but she still notes some polyphagia. She denies nausea or vomiting.  2. Other depression with emotional eating Mallory Charles is stable on her medications, and her mood is good. She is working on decreasing emotional eating behaviors.  3. At risk for heart disease Mallory Charles is at a higher than average risk for cardiovascular disease due to obesity.   Assessment/Plan:   1. Type 2 diabetes mellitus with other specified complication, without long-term current use of insulin (HCC) Ann agreed to increase Ozempic to 2 mg q weekly (she is ok to increase to 50 clicks first for approximately 10.5 mg dose). Good blood sugar control is important to decrease the likelihood of diabetic complications such as nephropathy, neuropathy, limb loss, blindness, coronary artery disease, and death. Intensive lifestyle modification including diet, exercise and weight loss are the first line of treatment for diabetes.   - Semaglutide, 2 MG/DOSE, 8 MG/3ML SOPN; Inject 2 mg as directed once a week.  Dispense: 3 mL; Refill: 0  2. Other  depression with emotional eating Behavior modification techniques were discussed today to help Mallory Charles deal with her emotional/non-hunger eating behaviors. We will refill Lexapro and Wellbutrin SR for 1 month. Orders and follow up as documented in patient record.   - buPROPion (WELLBUTRIN SR) 150 MG 12 hr tablet; Take 1 tablet (150 mg total) by mouth daily.  Dispense: 30 tablet; Refill: 0 - escitalopram (LEXAPRO) 10 MG tablet; Take 1 tablet (10 mg total) by mouth daily. For Depression  Dispense: 30 tablet; Refill: 0  3. At risk for heart disease [name] was given approximately 15 minutes of coronary artery disease prevention counseling today. She is 65 y.o. female and has risk factors for heart disease including obesity. We discussed intensive lifestyle modifications today with an emphasis on specific weight loss instructions and strategies.   Repetitive spaced learning was employed today to elicit superior memory formation and behavioral change.  4. Obesity BMI today is 38 Mallory Charles is currently in the action stage of change. As such, her goal is to continue with weight loss efforts. She has agreed to the Category 2 Plan and keeping a food journal and adhering to recommended goals of 500 calories and 35 grams of protein at supper daily.   Eating Out Ideas handout was given today.  Behavioral modification strategies: increasing lean protein intake and meal planning and cooking strategies.  Mallory Charles has agreed to follow-up with our clinic in 3 to 4 weeks. She was informed of the importance of frequent follow-up visits to maximize her success with intensive lifestyle modifications for her multiple health  conditions.   Objective:   Blood pressure 121/73, pulse 67, temperature 97.7 F (36.5 C), height 5\' 4"  (1.626 m), weight 260 lb (117.9 kg), SpO2 97 %. Body mass index is 44.63 kg/m.  General: Cooperative, alert, well developed, in no acute distress. HEENT: Conjunctivae and lids  unremarkable. Cardiovascular: Regular rhythm.  Lungs: Normal work of breathing. Neurologic: No focal deficits.   Lab Results  Component Value Date   CREATININE 0.77 09/08/2020   BUN 13 09/08/2020   NA 145 (H) 09/08/2020   K 4.0 09/08/2020   CL 107 (H) 09/08/2020   CO2 19 (L) 09/08/2020   Lab Results  Component Value Date   ALT 19 09/08/2020   AST 24 09/08/2020   ALKPHOS 111 09/08/2020   BILITOT 0.5 09/08/2020   Lab Results  Component Value Date   HGBA1C 6.6 (H) 09/08/2020   Lab Results  Component Value Date   INSULIN 6.2 09/08/2020   Lab Results  Component Value Date   TSH 0.904 09/08/2020   Lab Results  Component Value Date   CHOL 155 09/08/2020   HDL 34 (L) 09/08/2020   LDLCALC 95 09/08/2020   TRIG 145 09/08/2020   Lab Results  Component Value Date   VD25OH 40.8 09/08/2020   Lab Results  Component Value Date   WBC 6.2 09/08/2020   HGB 14.8 09/08/2020   HCT 44.7 09/08/2020   MCV 93 09/08/2020   No results found for: IRON, TIBC, FERRITIN  Attestation Statements:   Reviewed by clinician on day of visit: allergies, medications, problem list, medical history, surgical history, family history, social history, and previous encounter notes.   I, 09/10/2020, am acting as transcriptionist for Burt Knack, MD.  I have reviewed the above documentation for accuracy and completeness, and I agree with the above. -  Quillian Quince, MD

## 2021-02-08 ENCOUNTER — Ambulatory Visit (INDEPENDENT_AMBULATORY_CARE_PROVIDER_SITE_OTHER): Payer: BC Managed Care – PPO | Admitting: Family Medicine

## 2021-02-15 ENCOUNTER — Ambulatory Visit (INDEPENDENT_AMBULATORY_CARE_PROVIDER_SITE_OTHER): Payer: BC Managed Care – PPO | Admitting: Family Medicine

## 2021-03-03 ENCOUNTER — Encounter (INDEPENDENT_AMBULATORY_CARE_PROVIDER_SITE_OTHER): Payer: Self-pay | Admitting: Family Medicine

## 2021-03-03 ENCOUNTER — Other Ambulatory Visit: Payer: Self-pay

## 2021-03-03 ENCOUNTER — Ambulatory Visit (INDEPENDENT_AMBULATORY_CARE_PROVIDER_SITE_OTHER): Payer: BC Managed Care – PPO | Admitting: Family Medicine

## 2021-03-03 VITALS — BP 108/69 | HR 67 | Temp 98.3°F | Ht 64.0 in | Wt 261.0 lb

## 2021-03-03 DIAGNOSIS — Z6841 Body Mass Index (BMI) 40.0 and over, adult: Secondary | ICD-10-CM

## 2021-03-03 DIAGNOSIS — Z9189 Other specified personal risk factors, not elsewhere classified: Secondary | ICD-10-CM | POA: Diagnosis not present

## 2021-03-03 DIAGNOSIS — F3289 Other specified depressive episodes: Secondary | ICD-10-CM | POA: Diagnosis not present

## 2021-03-03 DIAGNOSIS — E1169 Type 2 diabetes mellitus with other specified complication: Secondary | ICD-10-CM | POA: Diagnosis not present

## 2021-03-03 MED ORDER — SEMAGLUTIDE (2 MG/DOSE) 8 MG/3ML ~~LOC~~ SOPN: 2.0000 mg | PEN_INJECTOR | SUBCUTANEOUS | 0 refills | Status: DC

## 2021-03-03 NOTE — Progress Notes (Signed)
Chief Complaint:   OBESITY Mallory Charles is here to discuss her progress with her obesity treatment plan along with follow-up of her obesity related diagnoses. Mallory Charles is on the Category 2 Plan and keeping a food journal and adhering to recommended goals of 500 calories and 35 grams of protein at supper daily and states she is following her eating plan approximately 10% of the time. Mallory Charles states she is doing 0 minutes 0 times per week.  Today's visit was #: 9 Starting weight: 277 lbs Starting date: 09/08/2020 Today's weight: 261 lbs Today's date: 03/03/2021 Total lbs lost to date: 16 Total lbs lost since last in-office visit: 0  Interim History: Mallory Charles has had a lot more temptations and stress, but she has done well with minimizing holiday weight gain. She has strategies for Christmas to help with portion control and she is working on increasing her lean protein.   Subjective:   1. Type 2 diabetes mellitus with other specified complication, without long-term current use of insulin (HCC) Mallory Charles is struggling to find Ozempic due to the shortage. She is working on finding which pharmacy has it in Regulatory affairs officer. She is also working on diet and weight loss.   2. Other depression with emotional eating Mallory Charles is on Lexapro and Prozac, and bupropion. She has an appointment with a new Psychologist coming up soon. She notes some increase irritability which appears appropriate to the situations she described.   3. At risk for heart disease Mallory Charles is at a higher than average risk for cardiovascular disease due to obesity.   Assessment/Plan:   1. Type 2 diabetes mellitus with other specified complication, without long-term current use of insulin (HCC) Mallory Charles will continue Ozempic, and we will refill for 1 month, and will continue to follow up as directed. Good blood sugar control is important to decrease the likelihood of diabetic complications such as nephropathy, neuropathy, limb loss, blindness, coronary artery  disease, and death. Intensive lifestyle modification including diet, exercise and weight loss are the first line of treatment for diabetes.   - Semaglutide, 2 MG/DOSE, 8 MG/3ML SOPN; Inject 2 mg as directed once a week.  Dispense: 3 mL; Refill: 0  2. Other depression with emotional eating Mallory Charles will continue her medications and follow up with her new Psychologist. She was offered support and encouragement, and will continue to follow up. Orders and follow up as documented in patient record.   3. At risk for heart disease Mallory Charles was given approximately 15 minutes of coronary artery disease prevention counseling today. She is 65 y.o. female and has risk factors for heart disease including obesity. We discussed intensive lifestyle modifications today with an emphasis on specific weight loss instructions and strategies.   Repetitive spaced learning was employed today to elicit superior memory formation and behavioral change.  4. Obesity with current BMI of 44.9 Mallory Charles is currently in the action stage of change. As such, her goal is to continue with weight loss efforts. She has agreed to the Category 2 Plan.   Behavioral modification strategies: meal planning and cooking strategies and holiday eating strategies .  Mallory Charles has agreed to follow-up with our clinic in 4 weeks. She was informed of the importance of frequent follow-up visits to maximize her success with intensive lifestyle modifications for her multiple health conditions.   Objective:   Blood pressure 108/69, pulse 67, temperature 98.3 F (36.8 C), height 5\' 4"  (1.626 m), weight 261 lb (118.4 kg), SpO2 98 %. Body mass index is 44.8  kg/m.  General: Cooperative, alert, well developed, in no acute distress. HEENT: Conjunctivae and lids unremarkable. Cardiovascular: Regular rhythm.  Lungs: Normal work of breathing. Neurologic: No focal deficits.   Lab Results  Component Value Date   CREATININE 0.77 09/08/2020   BUN 13 09/08/2020    NA 145 (H) 09/08/2020   K 4.0 09/08/2020   CL 107 (H) 09/08/2020   CO2 19 (L) 09/08/2020   Lab Results  Component Value Date   ALT 19 09/08/2020   AST 24 09/08/2020   ALKPHOS 111 09/08/2020   BILITOT 0.5 09/08/2020   Lab Results  Component Value Date   HGBA1C 6.6 (H) 09/08/2020   Lab Results  Component Value Date   INSULIN 6.2 09/08/2020   Lab Results  Component Value Date   TSH 0.904 09/08/2020   Lab Results  Component Value Date   CHOL 155 09/08/2020   HDL 34 (L) 09/08/2020   LDLCALC 95 09/08/2020   TRIG 145 09/08/2020   Lab Results  Component Value Date   VD25OH 40.8 09/08/2020   Lab Results  Component Value Date   WBC 6.2 09/08/2020   HGB 14.8 09/08/2020   HCT 44.7 09/08/2020   MCV 93 09/08/2020   No results found for: IRON, TIBC, FERRITIN  Attestation Statements:   Reviewed by clinician on day of visit: allergies, medications, problem list, medical history, surgical history, family history, social history, and previous encounter notes.   I, Burt Knack, am acting as transcriptionist for Quillian Quince, MD.  I have reviewed the above documentation for accuracy and completeness, and I agree with the above. -  Quillian Quince, MD

## 2021-03-28 ENCOUNTER — Ambulatory Visit (INDEPENDENT_AMBULATORY_CARE_PROVIDER_SITE_OTHER): Payer: BC Managed Care – PPO | Admitting: Professional

## 2021-03-28 ENCOUNTER — Encounter: Payer: Self-pay | Admitting: Professional

## 2021-03-28 DIAGNOSIS — F320 Major depressive disorder, single episode, mild: Secondary | ICD-10-CM | POA: Diagnosis not present

## 2021-03-28 NOTE — Progress Notes (Deleted)
° ° ° ° ° ° ° ° ° ° ° ° ° ° °  Peyton Rossner, LCMHC °

## 2021-03-28 NOTE — Progress Notes (Addendum)
[facility] Counselor Initial Adult Exam  Name: Mallory Charles Date: 03/28/2021 MRN: [id number] DOB: 03-Mar-1956 PCP: Brooke Bonito, MD  Time spent: 53 minutes 1002-1055 am  Guardian/Payee:  self   Paperwork requested: No   This session was held via video teletherapy due to the coronavirus risk at this time. The patient consented to video teletherapy and was located in her classroom during this session. She is aware it is the responsibility of the patient to secure confidentiality on her end of the session. The provider was in a private home office for the duration of this session.   The patient arrived late for her webex session due to connectivity issues.  Reason for Visit /Presenting Problem: depression and eating disorder  Mental Status Exam: Appearance:   Video unavailable on webex      Behavior:  Sharing  Motor:  Normal  Speech/Language:   Clear and Coherent and Normal Rate  Affect:  Full Range  Mood:  labile  Thought process:  goal directed  Thought content:    WNL  Sensory/Perceptual disturbances:    WNL  Orientation:  oriented to person, place, time/date, and situation  Attention:  Good  Concentration:  Good  Memory:  WNL  Fund of knowledge:   Good  Insight:    Good  Judgment:   Good  Impulse Control:  Good   Risk Assessment: Danger to Self:  No Self-injurious Behavior: No Danger to Others: No Duty to Warn:no Physical Aggression / Violence:No  Access to Firearms a concern: No  Gang Involvement:No  Patient / guardian was educated about steps to take if suicide or homicide risk level increases between visits: n/a While future psychiatric events cannot be accurately predicted, the patient does not currently require acute inpatient psychiatric care and does not currently meet [facility] involuntary commitment criteria.  Substance Abuse History: Current substance abuse: No     Past Psychiatric History:   Previous psychological history is  significant for depression Outpatient Providers: past therapy History of Psych Hospitalization: No  Psychological Testing:  n/a    Abuse History:  Victim of: No.   Report needed: No. Victim of Neglect:No. Perpetrator of n/a  Witness / Exposure to Domestic Violence: No   Protective Services Involvement: No  Witness to [employer] Violence:  No   Family History:  Family History  Problem Relation Age of Onset   Hyperlipidemia Mother    Heart failure Father    Diabetes Father    Heart disease Father    Obesity Father     Living situation: the patient lives alone  Sexual Orientation: Straight  Relationship Status: divorced  Name of spouse / other:n/a If a parent, number of children / ages: one adult daughter and grandchild  Support Systems: daughter  Surveyor, quantity Stress:  No   Income/Employment/Disability: Employment  Financial planner: No   Educational History: Education:  deferred  Religion/Sprituality/World View: deferred  Any cultural differences that may affect / interfere with treatment:  none  Recreation/Hobbies: deferred  Stressors: Health problems   Other: 5 year old mother fell and broke her hip, currently has blood clots in lungs    Strengths: Friends, Hopefulness, and Able to Communicate Effectively  Barriers:  emotional eating   Legal History: Pending legal issue / charges: The patient has no significant history of legal issues. History of legal issue / charges: na  Medical History/Surgical History: reviewed Past Medical History:  Diagnosis Date   ADD (attention deficit disorder)    Congenital absence of  one kidney    Coronary artery disease    Depression    Diabetes mellitus without complication (HCC)    Edema of both lower extremities    Fatty liver    GERD (gastroesophageal reflux disease)    Hyperlipidemia    Hypertension    IBS (irritable bowel syndrome)    Incontinence    Kidney problem    Palpitation    Rapid heart beat     Rosacea    Vertigo     Past Surgical History:  Procedure Laterality Date   ABDOMINAL HYSTERECTOMY     APPENDECTOMY     CHOLECYSTECTOMY     KNEE SURGERY     LAPAROSCOPIC GASTRIC BANDING     ROTATOR CUFF REPAIR     TONSILLECTOMY      Medications: Current Outpatient Medications  Medication Sig Dispense Refill   ampicillin (PRINCIPEN) 500 MG capsule Take 500 mg by mouth as needed.     buPROPion (WELLBUTRIN SR) 150 MG 12 hr tablet Take 1 tablet (150 mg total) by mouth daily. 30 tablet 0   cholecalciferol (VITAMIN D) 1000 UNITS tablet Take 1,000 Units by mouth daily.     escitalopram (LEXAPRO) 10 MG tablet Take 1 tablet (10 mg total) by mouth daily. For Depression 30 tablet 0   FLUoxetine (PROZAC) 20 MG capsule Take 20 mg by mouth daily.     metFORMIN (GLUCOPHAGE-XR) 750 MG 24 hr tablet Take 750 mg by mouth 2 (two) times daily.     metoprolol succinate (TOPROL-XL) 25 MG 24 hr tablet Take 12.5 mg by mouth daily.     polyethylene glycol powder (GLYCOLAX/MIRALAX) 17 GM/SCOOP powder Take once a day with 8oz of water. 3350 g 0   Semaglutide, 2 MG/DOSE, 8 MG/3ML SOPN Inject 2 mg as directed once a week. 3 mL 0   Zinc Sulfate (ZINC-220 PO) Take by mouth.     No current facility-administered medications for this visit.    Allergies  Allergen Reactions   Morphine And Related     Blood pressure drops    Diagnoses:  No diagnosis found.  Plan of Care:  -address emotional eating, coping skills and self-esteem issues -come prepared to next visit to further develop treatment goals -meet again on Tuesday, April 12, 2021 at [facility]

## 2021-04-05 ENCOUNTER — Other Ambulatory Visit: Payer: Self-pay

## 2021-04-05 ENCOUNTER — Encounter (INDEPENDENT_AMBULATORY_CARE_PROVIDER_SITE_OTHER): Payer: Self-pay | Admitting: Family Medicine

## 2021-04-05 ENCOUNTER — Ambulatory Visit (INDEPENDENT_AMBULATORY_CARE_PROVIDER_SITE_OTHER): Payer: BC Managed Care – PPO | Admitting: Family Medicine

## 2021-04-05 VITALS — BP 103/70 | HR 77 | Temp 98.3°F | Ht 64.0 in | Wt 262.0 lb

## 2021-04-05 DIAGNOSIS — R7303 Prediabetes: Secondary | ICD-10-CM | POA: Diagnosis not present

## 2021-04-05 DIAGNOSIS — Z6841 Body Mass Index (BMI) 40.0 and over, adult: Secondary | ICD-10-CM

## 2021-04-05 DIAGNOSIS — F3289 Other specified depressive episodes: Secondary | ICD-10-CM | POA: Diagnosis not present

## 2021-04-05 DIAGNOSIS — E669 Obesity, unspecified: Secondary | ICD-10-CM

## 2021-04-05 DIAGNOSIS — Z9189 Other specified personal risk factors, not elsewhere classified: Secondary | ICD-10-CM

## 2021-04-05 MED ORDER — BUPROPION HCL ER (SR) 150 MG PO TB12: 150.0000 mg | ORAL_TABLET | Freq: Every day | ORAL | 0 refills | Status: AC

## 2021-04-05 MED ORDER — SEMAGLUTIDE (2 MG/DOSE) 8 MG/3ML ~~LOC~~ SOPN: 2.0000 mg | PEN_INJECTOR | SUBCUTANEOUS | 0 refills | Status: DC

## 2021-04-05 MED ORDER — ESCITALOPRAM OXALATE 10 MG PO TABS: 10.0000 mg | ORAL_TABLET | Freq: Every day | ORAL | 0 refills | Status: AC

## 2021-04-06 NOTE — Progress Notes (Signed)
Chief Complaint:   OBESITY Mallory Charles is here to discuss her progress with her obesity treatment plan along with follow-up of her obesity related diagnoses. Mallory Charles is on the Category 2 Plan and states she is following her eating plan approximately 25% of the time. Mallory Charles states she is doing 0 minutes 0 times per week.  Today's visit was #: 10 Starting weight: 277 lbs Starting date: 09/08/2020 Today's weight: 262 lbs Today's date: 04/05/2021 Total lbs lost to date: 15 Total lbs lost since last in-office visit: 0  Interim History: Mallory Charles hasn't been able to concentrate on weight loss while caring for her mother who recently broke her hip. She has tried to increase her protein.  Subjective:   1. Pre-diabetes Mallory Charles is stable on Ozempic, and she denies nausea or vomiting. She is working on decreasing simple carbohydrates.  2. Other depression with emotional eating Mallory Charles is stable on her medications. She is working on decreasing emotional eating behaviors, but she has been dealing with increased stress.  3. At risk for diabetes mellitus Mallory Charles is at higher than average risk for developing diabetes due to obesity.   Assessment/Plan:   1. Pre-diabetes Mallory Charles will continue to work on weight loss, exercise, and decreasing simple carbohydrates to help decrease the risk of diabetes. We will refill Ozempic 2 mg for 1 month.  - Semaglutide, 2 MG/DOSE, 8 MG/3ML SOPN; Inject 2 mg as directed once a week.  Dispense: 3 mL; Refill: 0  2. Other depression with emotional eating Behavior modification techniques were discussed today to help Mallory Charles deal with her emotional/non-hunger eating behaviors. We will refill Wellbutrin SR and Lexapro for 1 month. Orders and follow up as documented in patient record.   - buPROPion (WELLBUTRIN SR) 150 MG 12 hr tablet; Take 1 tablet (150 mg total) by mouth daily.  Dispense: 30 tablet; Refill: 0 - escitalopram (LEXAPRO) 10 MG tablet; Take 1 tablet (10 mg total) by mouth  daily. For Depression  Dispense: 30 tablet; Refill: 0  3. At risk for diabetes mellitus Mallory Charles was given approximately 15 minutes of diabetes education and counseling today. We discussed intensive lifestyle modifications today with an emphasis on weight loss as well as increasing exercise and decreasing simple carbohydrates in her diet. We also reviewed medication options with an emphasis on risk versus benefit of those discussed.   Repetitive spaced learning was employed today to elicit superior memory formation and behavioral change.   4. Obesity with current BMI of 45.1 Mallory Charles is currently in the action stage of change. As such, her goal is to continue with weight loss efforts. She has agreed to the Category 2 Plan.   Exercise goals: Increase walking when possible.  Behavioral modification strategies: increasing lean protein intake and meal planning and cooking strategies.  Mallory Charles has agreed to follow-up with our clinic in 3 weeks. She was informed of the importance of frequent follow-up visits to maximize her success with intensive lifestyle modifications for her multiple health conditions.   Objective:   Blood pressure 103/70, pulse 77, temperature 98.3 F (36.8 C), height 5\' 4"  (1.626 m), weight 262 lb (118.8 kg), SpO2 97 %. Body mass index is 44.97 kg/m.  General: Cooperative, alert, well developed, in no acute distress. HEENT: Conjunctivae and lids unremarkable. Cardiovascular: Regular rhythm.  Lungs: Normal work of breathing. Neurologic: No focal deficits.   Lab Results  Component Value Date   CREATININE 0.77 09/08/2020   BUN 13 09/08/2020   NA 145 (H) 09/08/2020  K 4.0 09/08/2020   CL 107 (H) 09/08/2020   CO2 19 (L) 09/08/2020   Lab Results  Component Value Date   ALT 19 09/08/2020   AST 24 09/08/2020   ALKPHOS 111 09/08/2020   BILITOT 0.5 09/08/2020   Lab Results  Component Value Date   HGBA1C 6.6 (H) 09/08/2020   Lab Results  Component Value Date   INSULIN  6.2 09/08/2020   Lab Results  Component Value Date   TSH 0.904 09/08/2020   Lab Results  Component Value Date   CHOL 155 09/08/2020   HDL 34 (L) 09/08/2020   LDLCALC 95 09/08/2020   TRIG 145 09/08/2020   Lab Results  Component Value Date   VD25OH 40.8 09/08/2020   Lab Results  Component Value Date   WBC 6.2 09/08/2020   HGB 14.8 09/08/2020   HCT 44.7 09/08/2020   MCV 93 09/08/2020   No results found for: IRON, TIBC, FERRITIN  Attestation Statements:   Reviewed by clinician on day of visit: allergies, medications, problem list, medical history, surgical history, family history, social history, and previous encounter notes.   I, Trixie Dredge, am acting as transcriptionist for Dennard Nip, MD.  I have reviewed the above documentation for accuracy and completeness, and I agree with the above. -  Dennard Nip, MD

## 2021-04-12 ENCOUNTER — Ambulatory Visit (INDEPENDENT_AMBULATORY_CARE_PROVIDER_SITE_OTHER): Payer: BC Managed Care – PPO | Admitting: Professional

## 2021-04-12 ENCOUNTER — Encounter: Payer: Self-pay | Admitting: Professional

## 2021-04-12 DIAGNOSIS — F320 Major depressive disorder, single episode, mild: Secondary | ICD-10-CM

## 2021-04-12 NOTE — Progress Notes (Deleted)
° ° ° ° ° ° ° ° ° ° ° ° ° ° °  Mallory Charles, LCMHC °
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
## 2021-04-12 NOTE — Progress Notes (Deleted)
° ° ° ° ° ° ° ° ° ° ° ° ° ° °  Jaionna Weisse, LCMHC °

## 2021-04-13 ENCOUNTER — Ambulatory Visit (INDEPENDENT_AMBULATORY_CARE_PROVIDER_SITE_OTHER): Payer: BC Managed Care – PPO | Admitting: Family Medicine

## 2021-04-20 ENCOUNTER — Ambulatory Visit: Payer: BC Managed Care – PPO | Admitting: Professional

## 2021-04-21 ENCOUNTER — Ambulatory Visit: Payer: BC Managed Care – PPO | Admitting: Professional

## 2021-04-26 ENCOUNTER — Ambulatory Visit: Payer: BC Managed Care – PPO | Admitting: Professional

## 2021-05-06 ENCOUNTER — Other Ambulatory Visit (INDEPENDENT_AMBULATORY_CARE_PROVIDER_SITE_OTHER): Payer: Self-pay | Admitting: Family Medicine

## 2021-05-06 DIAGNOSIS — R7303 Prediabetes: Secondary | ICD-10-CM

## 2021-05-09 NOTE — Telephone Encounter (Signed)
Dr.Beasley 

## 2021-05-10 ENCOUNTER — Encounter (INDEPENDENT_AMBULATORY_CARE_PROVIDER_SITE_OTHER): Payer: Self-pay | Admitting: Family Medicine

## 2021-05-10 ENCOUNTER — Telehealth (INDEPENDENT_AMBULATORY_CARE_PROVIDER_SITE_OTHER): Payer: BC Managed Care – PPO | Admitting: Family Medicine

## 2021-05-10 DIAGNOSIS — U071 COVID-19: Secondary | ICD-10-CM | POA: Diagnosis not present

## 2021-05-10 DIAGNOSIS — E669 Obesity, unspecified: Secondary | ICD-10-CM

## 2021-05-10 DIAGNOSIS — E1169 Type 2 diabetes mellitus with other specified complication: Secondary | ICD-10-CM | POA: Diagnosis not present

## 2021-05-10 DIAGNOSIS — Z6841 Body Mass Index (BMI) 40.0 and over, adult: Secondary | ICD-10-CM | POA: Diagnosis not present

## 2021-05-10 DIAGNOSIS — Z7985 Long-term (current) use of injectable non-insulin antidiabetic drugs: Secondary | ICD-10-CM

## 2021-05-10 MED ORDER — SEMAGLUTIDE (2 MG/DOSE) 8 MG/3ML ~~LOC~~ SOPN: 2.0000 mg | PEN_INJECTOR | SUBCUTANEOUS | 0 refills | Status: DC

## 2021-05-11 NOTE — Progress Notes (Signed)
TeleHealth Visit:  Due to the COVID-19 pandemic, this visit was completed with telemedicine (audio/video) technology to reduce patient and provider exposure as well as to preserve personal protective equipment.   Mallory Charles has verbally consented to this TeleHealth visit. The patient is located at home, the provider is located at the [facility] office. The participants in this visit include the listed provider and patient. The visit was conducted today via MyChart video.   Chief Complaint: OBESITY Mallory Charles is here to discuss her progress with her obesity treatment plan along with follow-up of her obesity related diagnoses. Mallory Charles is on the Category 2 Plan and states she is following her eating plan approximately 0% of the time. Mallory Charles states she is doing 0 minutes 0 times per week.  Today's visit was #: 11 Starting weight: 277 lbs Starting date: 09/08/2020  Interim History: Mallory Charles has been sick recently and unable to follow her plan closely with decreased appetite. She denies vomiting, but she is able to stay hydrated.   Subjective:   1. COVID-19 virus infection Mallory Charles started with symptoms 5 days ago. She tested positive and was prescribed Paxlovid, but she hasn't started it. She denies shortness of breath. She notes pharyngitis and laryngitis symptoms.   2. Type 2 diabetes mellitus with other specified complication, without long-term current use of insulin (HCC) Mallory Charles's recent A1c was 6.6. She is stable on Ozempic. Her fasting blood sugars run at 110's even while sick recently. She has some nausea with COVID, but still took Ozempic. She has not had any vomiting.   Assessment/Plan:   1. COVID-19 virus infection Mallory Charles was encouraged to rest and hydrates, and not worry about exercise until mostly recovered.  2. Type 2 diabetes mellitus with other specified complication, without long-term current use of insulin (HCC) We will refill Ozempic for 1 month. James is ok to skip if she  is too nauseated, and will work on her eating plan after she is feeling better. Good blood sugar control is important to decrease the likelihood of diabetic complications such as nephropathy, neuropathy, limb loss, blindness, coronary artery disease, and death. Intensive lifestyle modification including diet, exercise and weight loss are the first line of treatment for diabetes.   - Semaglutide, 2 MG/DOSE, 8 MG/3ML SOPN; Inject 2 mg as directed once a week.  Dispense: 3 mL; Refill: 0  3. Obesity with current BMI of 45.1 Mallory Charles is currently in the action stage of change. As such, her goal is to continue with weight loss efforts. She has agreed to the Category 2 Plan.   Mallory Charles is to not worry about weight loss until she is feeling better. She will work on hydration and try to eat a little as tolerated.   Behavioral modification strategies: increasing water intake.  Mallory Charles has agreed to follow-up with our clinic in 3 to 4 weeks. She was informed of the importance of frequent follow-up visits to maximize her success with intensive lifestyle modifications for her multiple health conditions.  Objective:   VITALS: Per patient if applicable, see vitals. GENERAL: Alert and in no acute distress. CARDIOPULMONARY: No increased WOB. Speaking in clear sentences.  PSYCH: Pleasant and cooperative. Speech normal rate and rhythm. Affect is appropriate. Insight and judgement are appropriate. Attention is focused, linear, and appropriate.  NEURO: Oriented as arrived to appointment on time with no prompting.   Lab Results  Component Value Date   CREATININE 0.77 09/08/2020   BUN 13 09/08/2020   NA 145 (H) 09/08/2020  K 4.0 09/08/2020   CL 107 (H) 09/08/2020   CO2 19 (L) 09/08/2020   Lab Results  Component Value Date   ALT 19 09/08/2020   AST 24 09/08/2020   ALKPHOS 111 09/08/2020   BILITOT 0.5 09/08/2020   Lab Results  Component Value Date   HGBA1C 6.6 (H) 09/08/2020   Lab Results  Component Value  Date   INSULIN 6.2 09/08/2020   Lab Results  Component Value Date   TSH 0.904 09/08/2020   Lab Results  Component Value Date   CHOL 155 09/08/2020   HDL 34 (L) 09/08/2020   LDLCALC 95 09/08/2020   TRIG 145 09/08/2020   Lab Results  Component Value Date   VD25OH 40.8 09/08/2020   Lab Results  Component Value Date   WBC 6.2 09/08/2020   HGB 14.8 09/08/2020   HCT 44.7 09/08/2020   MCV 93 09/08/2020   No results found for: IRON, TIBC, FERRITIN  Attestation Statements:   Reviewed by clinician on day of visit: allergies, medications, problem list, medical history, surgical history, family history, social history, and previous encounter notes.   I, Burt Knack, am acting as transcriptionist for Quillian Quince, MD.  I have reviewed the above documentation for accuracy and completeness, and I agree with the above. - Quillian Quince, MD

## 2021-05-18 ENCOUNTER — Ambulatory Visit (INDEPENDENT_AMBULATORY_CARE_PROVIDER_SITE_OTHER): Payer: BC Managed Care – PPO | Admitting: Professional

## 2021-05-18 DIAGNOSIS — F320 Major depressive disorder, single episode, mild: Secondary | ICD-10-CM
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
## 2021-05-26 ENCOUNTER — Encounter: Payer: Self-pay | Admitting: Professional

## 2021-05-26 ENCOUNTER — Ambulatory Visit (INDEPENDENT_AMBULATORY_CARE_PROVIDER_SITE_OTHER): Payer: BC Managed Care – PPO | Admitting: Professional

## 2021-05-26 DIAGNOSIS — F320 Major depressive disorder, single episode, mild: Secondary | ICD-10-CM
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
## 2021-05-29 ENCOUNTER — Encounter (INDEPENDENT_AMBULATORY_CARE_PROVIDER_SITE_OTHER): Payer: Self-pay | Admitting: Family Medicine

## 2021-05-30 ENCOUNTER — Ambulatory Visit (INDEPENDENT_AMBULATORY_CARE_PROVIDER_SITE_OTHER): Payer: BC Managed Care – PPO | Admitting: Family Medicine

## 2021-05-30 ENCOUNTER — Encounter (INDEPENDENT_AMBULATORY_CARE_PROVIDER_SITE_OTHER): Payer: Self-pay

## 2021-06-01 ENCOUNTER — Ambulatory Visit: Payer: BC Managed Care – PPO | Admitting: Professional

## 2021-06-01 ENCOUNTER — Encounter: Payer: Self-pay | Admitting: Professional

## 2021-06-01 DIAGNOSIS — F320 Major depressive disorder, single episode, mild: Secondary | ICD-10-CM
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
## 2021-06-08 ENCOUNTER — Ambulatory Visit: Payer: BC Managed Care – PPO | Admitting: Professional

## 2021-06-29 ENCOUNTER — Ambulatory Visit: Payer: BC Managed Care – PPO | Admitting: Professional

## 2021-07-06 ENCOUNTER — Ambulatory Visit: Payer: BC Managed Care – PPO | Admitting: Professional

## 2021-08-02 ENCOUNTER — Ambulatory Visit: Payer: BC Managed Care – PPO | Admitting: Professional

## 2021-08-17 ENCOUNTER — Ambulatory Visit: Payer: BC Managed Care – PPO | Admitting: Professional

## 2021-09-30 ENCOUNTER — Ambulatory Visit: Payer: BC Managed Care – PPO | Admitting: Professional

## 2021-10-14 ENCOUNTER — Ambulatory Visit: Payer: BC Managed Care – PPO | Admitting: Professional

## 2021-10-19 ENCOUNTER — Encounter (INDEPENDENT_AMBULATORY_CARE_PROVIDER_SITE_OTHER): Payer: Self-pay

## 2022-05-03 ENCOUNTER — Ambulatory Visit (INDEPENDENT_AMBULATORY_CARE_PROVIDER_SITE_OTHER): Payer: No Typology Code available for payment source

## 2022-05-03 ENCOUNTER — Ambulatory Visit (INDEPENDENT_AMBULATORY_CARE_PROVIDER_SITE_OTHER): Payer: No Typology Code available for payment source | Admitting: Orthopaedic Surgery

## 2022-05-03 ENCOUNTER — Encounter: Payer: Self-pay | Admitting: Orthopaedic Surgery

## 2022-05-03 VITALS — Ht 64.0 in | Wt 263.4 lb

## 2022-05-03 DIAGNOSIS — M25562 Pain in left knee: Secondary | ICD-10-CM | POA: Diagnosis not present

## 2022-05-03 DIAGNOSIS — M25561 Pain in right knee: Secondary | ICD-10-CM

## 2022-05-03 MED ORDER — METHYLPREDNISOLONE ACETATE 40 MG/ML IJ SUSP
40.0000 mg | INTRAMUSCULAR | Status: AC | PRN
  Administered 2022-05-03: 40 mg via INTRA_ARTICULAR

## 2022-05-03 MED ORDER — LIDOCAINE HCL 1 % IJ SOLN
3.0000 mL | INTRAMUSCULAR | Status: AC | PRN
  Administered 2022-05-03: 3 mL

## 2022-05-03 NOTE — Progress Notes (Signed)
The patient is a 67 year old female who comes in for evaluation of bilateral knee pain after mechanical fall out of a chair at work.  She did have some ankle pain and shoulder pain after that fall but those have subsided significantly.  Both her knees do hurt.  If she has been standing for a long period time her knees hurt.  If she has been sitting for a while can get out of a chair both her knees hurt.  There is a family history of arthritis with family members having knee replacement surgery.  She does have remote history of a left knee arthroscopy back in 2000.  She has not had any recent injections in her knees or any physical therapy since this injury happened last month.  She is back to work.  Examination of both knees does show patellofemoral cavitation throughout the arc of motion of both knees.  Both knees hyperextend.  Both knees are ligamentously stable.  Both knees have significant patellofemoral crepitation.  X-rays of both knees were obtained today.  There is no acute findings no evidence of fracture but there is moderate tricompartment arthritis in both knees.   I did recommend a steroid injection in both knees today which she agreed to and tolerated well.  She is likely a good candidate for hyaluronic acid for her knees.  I would like to see her back in just 2 weeks to see how she is responding to the steroid injections.  She may end up benefiting from outpatient physical therapy.  Certainly weight loss will help.  Her BMI is 45.  Again, I do feel that she has pre-existing arthritis in her knees that have flared up from her mechanical fall.  From what she describes it does not sound like there is a ligamentous injury and I do not feel there is a ligamentous injury on either knee based on her exam.       Procedure Note  Patient: Mallory Charles             Date of Birth: 1955-10-05           MRN: [id number]             Visit Date: 05/03/2022  Procedures: Visit Diagnoses:  1. Acute  pain of right knee   2. Acute pain of left knee     Large Joint Inj: R knee on 05/03/2022 1:52 PM Indications: diagnostic evaluation and pain Details: 22 G 1.5 in needle, superolateral approach  Arthrogram: No  Medications: 3 mL lidocaine 1 %; 40 mg methylPREDNISolone acetate 40 MG/ML Outcome: tolerated well, no immediate complications Procedure, treatment alternatives, risks and benefits explained, specific risks discussed. Consent was given by the patient. Immediately prior to procedure a time out was called to verify the correct patient, procedure, equipment, support staff and site/side marked as required. Patient was prepped and draped in the usual sterile fashion.    Large Joint Inj: L knee on 05/03/2022 1:52 PM Indications: diagnostic evaluation and pain Details: 22 G 1.5 in needle, superolateral approach  Arthrogram: No  Medications: 3 mL lidocaine 1 %; 40 mg methylPREDNISolone acetate 40 MG/ML Outcome: tolerated well, no immediate complications Procedure, treatment alternatives, risks and benefits explained, specific risks discussed. Consent was given by the patient. Immediately prior to procedure a time out was called to verify the correct patient, procedure, equipment, support staff and site/side marked as required. Patient was prepped and draped in the usual sterile fashion.

## 2022-05-18 ENCOUNTER — Encounter: Payer: Self-pay | Admitting: Radiology

## 2022-05-18 ENCOUNTER — Ambulatory Visit: Payer: Self-pay | Admitting: Physician Assistant

## 2022-06-01 ENCOUNTER — Ambulatory Visit: Payer: No Typology Code available for payment source | Admitting: Physician Assistant

## 2022-06-01 ENCOUNTER — Encounter: Payer: Self-pay | Admitting: Physician Assistant

## 2022-06-01 DIAGNOSIS — M1711 Unilateral primary osteoarthritis, right knee: Secondary | ICD-10-CM

## 2022-06-01 DIAGNOSIS — M1712 Unilateral primary osteoarthritis, left knee: Secondary | ICD-10-CM | POA: Diagnosis not present

## 2022-06-01 NOTE — Progress Notes (Signed)
HPI: Mallory Charles returns today status post bilateral knee injections 05/03/2022.  She states she no longer has any pain in either knee.  Denies any mechanical symptoms of either knee.  She states she did have a small knot over the injection site of the left knee but this is resolved.  She notes chronic weakness of both legs whenever going up and down stairs.  Review of systems: See HPI otherwise negative  Physical exam: Bilateral knees good range of motion both knees.  Patellofemoral crepitus both knees with..  No abnormal warmth erythema or effusion of either knee.  Impression: Left knee osteoarthritis Right knee osteoarthritis  Plan: She will work on quad strengthening as shown.  Also discussed knee friendly exercises such as exercise bike, elliptical or swimming.  Follow-up with [place] on an as-needed basis.  She understands to wait least 3 months between injections.  She does not get relief that lasted at least 3 months with consider hyaluronic acid for her knees.  Questions were encouraged and answered

## 2023-04-16 ENCOUNTER — Ambulatory Visit: Payer: 59 | Admitting: Orthopaedic Surgery

## 2023-04-16 ENCOUNTER — Other Ambulatory Visit (INDEPENDENT_AMBULATORY_CARE_PROVIDER_SITE_OTHER): Payer: Self-pay

## 2023-04-16 VITALS — Wt 228.0 lb

## 2023-04-16 DIAGNOSIS — M25551 Pain in right hip: Secondary | ICD-10-CM

## 2023-04-16 MED ORDER — GABAPENTIN 300 MG PO CAPS: 300.0000 mg | ORAL_CAPSULE | Freq: Every day | ORAL | 1 refills | Status: DC

## 2023-04-16 MED ORDER — CELECOXIB 200 MG PO CAPS: 200.0000 mg | ORAL_CAPSULE | Freq: Two times a day (BID) | ORAL | 1 refills | Status: DC | PRN

## 2023-04-16 NOTE — Progress Notes (Signed)
The patient is a 68 year old female that we have seen in the recent past.  She reports right hip pain but she points to the posterior pelvis area on the right side as a source of her pain and she does get a burning pain with this.  She is not diabetic but her last hemoglobin A1c was 5.6.  She says it does feel like is a deep pain.  She denies any specific injuries.  She denies any radicular component and denies any groin pain.  Her only other comorbidities she is obese with a BMI of 39.14.  She tries to stay active and this is been bothering her for just only a short amount of time for about 6 weeks or so.  On exam her right hip moves smoothly and fluidly.  There is pain to palpation over the posterior pelvis area as it heads down toward the ischium.  There is no blocks to rotation of her right hip.  She has a little bit of pain over the trochanteric area.  An AP pelvis and lateral of her right hip shows no gross or acute findings.  The hip joint space is well-maintained.  This may be more pain of the SI joint area.  I would like to put her on Celebrex 200 mg twice a day as well as Neurontin 300 mg at bedtime.  Will then see her back in a month to see how she is doing overall.  We would consider sending her to Dr. Alvester Morin for an SI joint injection if this is not getting better.

## 2023-05-14 ENCOUNTER — Other Ambulatory Visit: Payer: Self-pay

## 2023-05-14 ENCOUNTER — Encounter: Payer: Self-pay | Admitting: Orthopaedic Surgery

## 2023-05-14 ENCOUNTER — Ambulatory Visit (INDEPENDENT_AMBULATORY_CARE_PROVIDER_SITE_OTHER): Payer: Self-pay | Admitting: Orthopaedic Surgery

## 2023-05-14 DIAGNOSIS — G8929 Other chronic pain: Secondary | ICD-10-CM | POA: Diagnosis not present

## 2023-05-14 DIAGNOSIS — M5441 Lumbago with sciatica, right side: Secondary | ICD-10-CM

## 2023-05-14 DIAGNOSIS — M25551 Pain in right hip: Secondary | ICD-10-CM | POA: Diagnosis not present

## 2023-05-14 NOTE — Progress Notes (Signed)
 The patient is a 68 year old female well-known to [place].  We have been treating her for right hip pain but it seems to be more in the pelvis and radicular nature with some sciatica.  She has tried activity modification as well as rest.  We have had her on Celebrex and gabapentin.  She is still having the same mild discomfort and a burning sensation.  On exam her right hip still move smoothly and fluidly.  X-rays from her last visit did not show any worrisome features around the lower pelvis or the SI joint.  Years ago she did see a spine specialist at [facility].  At this point a MRI of her lumbar spine is warranted given her continued radicular pain.  On exam her right hip moves smoothly and fluidly but again her pain seems to be sciatic related and its at the lower lumbar spine that radiates into the pelvis to the right side only.  She does have a positive straight leg raise on that side but excellent strength in her bilateral extremities.  She is in agreement of ordering this MRI.  She is claustrophobic so she would like to have an open MRI.  We will then see her back in follow-up once we have that MRI.

## 2023-07-12 ENCOUNTER — Ambulatory Visit (INDEPENDENT_AMBULATORY_CARE_PROVIDER_SITE_OTHER): Admitting: Orthopaedic Surgery

## 2023-07-12 ENCOUNTER — Encounter: Payer: Self-pay | Admitting: Orthopaedic Surgery

## 2023-07-12 DIAGNOSIS — M5441 Lumbago with sciatica, right side: Secondary | ICD-10-CM

## 2023-07-12 DIAGNOSIS — G8929 Other chronic pain: Secondary | ICD-10-CM

## 2023-07-12 NOTE — Progress Notes (Signed)
 The patient comes in today to go over a MRI of her lumbar spine.  She is having a lot of low back pain that did radiate some in the sciatic region on the right side.  The MRI of her lumbar spine does show a grade 1 to grade 2 spondylolisthesis at L5-S1 that is causing moderate to severe stenosis on the right side and severe stenosis of the left side which could be affecting the L V nerve roots on both sides.  She still reports significant low back pain but no radicular symptoms today.  She would definitely benefit from outpatient physical therapy for core strengthening and any other modalities that could help calm down her low back pain that is facet mediated.  She is willing to try some therapy first.  I have encouraged her to actually pick up the disc with the MRI findings so she will have that if she ends up needing injections or even surgery on her lumbar spine.  Will see her back in 6 weeks to see how she is doing from a therapy standpoint at that visit I would like a standing AP and lateral of her lumbar spine.

## 2023-07-12 NOTE — Addendum Note (Signed)
 Addended by: Clancy Crimes B on: 07/12/2023 05:31 PM   Modules accepted: Orders

## 2023-08-22 ENCOUNTER — Ambulatory Visit (INDEPENDENT_AMBULATORY_CARE_PROVIDER_SITE_OTHER): Admitting: Rehabilitative and Restorative Service Providers"

## 2023-08-22 ENCOUNTER — Encounter: Payer: Self-pay | Admitting: Rehabilitative and Restorative Service Providers"

## 2023-08-22 DIAGNOSIS — R293 Abnormal posture: Secondary | ICD-10-CM

## 2023-08-22 DIAGNOSIS — M5459 Other low back pain: Secondary | ICD-10-CM

## 2023-08-22 DIAGNOSIS — M6281 Muscle weakness (generalized): Secondary | ICD-10-CM

## 2023-08-22 NOTE — Therapy (Signed)
 OUTPATIENT PHYSICAL THERAPY THORACOLUMBAR EVALUATION   Patient Name: Mallory Charles MRN: [id number] DOB:March 13, 1956, 68 y.o., female Today's Date: 08/22/2023  END OF SESSION:  PT End of Session - 08/22/23 1556     Visit Number 1    Number of Visits 16    Date for PT Re-Evaluation 10/17/23    Progress Note Due on Visit 10    PT Start Time 1100    PT Stop Time 1145    PT Time Calculation (min) 45 min    Activity Tolerance Patient tolerated treatment well;No increased pain    Behavior During Therapy WFL for tasks assessed/performed             Past Medical History:  Diagnosis Date   ADD (attention deficit disorder)    Congenital absence of one kidney    Coronary artery disease    Depression    Diabetes mellitus without complication (HCC)    Edema of both lower extremities    Fatty liver    GERD (gastroesophageal reflux disease)    Hyperlipidemia    Hypertension    IBS (irritable bowel syndrome)    Incontinence    Kidney problem    Palpitation    Rapid heart beat    Rosacea    Vertigo    Past Surgical History:  Procedure Laterality Date   ABDOMINAL HYSTERECTOMY     APPENDECTOMY     CHOLECYSTECTOMY     KNEE SURGERY     LAPAROSCOPIC GASTRIC BANDING     ROTATOR CUFF REPAIR     TONSILLECTOMY     Patient Active Problem List   Diagnosis Date Noted   Current mild episode of major depressive disorder without prior episode (HCC) 04/12/2021   Constipation 10/20/2020   Depression 10/20/2020   Diabetes mellitus (HCC) 10/20/2020   Class 3 severe obesity with serious comorbidity and body mass index (BMI) of 45.0 to 49.9 in adult 10/20/2020    PCP: Nance Aw, MD  REFERRING PROVIDER: Jeanella Milan. Lucienne Ryder, MD  REFERRING DIAG:  Diagnosis  M54.41,G89.29 (ICD-10-CM) - Chronic right-sided low back pain with right-sided sciatica    Rationale for Evaluation and Treatment: Rehabilitation  THERAPY DIAG:  Abnormal posture - Plan: PT plan of care  cert/re-cert  Muscle weakness (generalized) - Plan: PT plan of care cert/re-cert  Other low back pain - Plan: PT plan of care cert/re-cert  ONSET DATE: Chronic, worsening over the past few months  SUBJECTIVE:  SUBJECTIVE STATEMENT: Mallory Charles notes left > right gluteal pain for several months.  Lower back pain is noted in the mornings that takes a while to get going.  Difficulty with prolonged standing and activities that involve flexion.  She does OK with walking.    PERTINENT HISTORY:  1 kidney, CAD, DM Type 2, previous knee surgery and rotator cuff repair  PAIN:  Are you having pain? Yes: NPRS scale: Low back and gluteal pain as high as 5/10 over the past week Pain location: Lower back and gluteal Pain description: Stiffness in the AM, ache in the PM Aggravating factors: Morning stiffness, prolonged postures Relieving factors: NA  PRECAUTIONS: Back  RED FLAGS: None   WEIGHT BEARING RESTRICTIONS: No  FALLS:  Has patient fallen in last 6 months? 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  DIAGNOSTIC FINDINGS:  Grade 1-2 L5-S1 spondylolisthesis, severe left and moderate to severe right foraminal stenosis  PATIENT SURVEYS:  Patient-Specific Activity Scoring Scheme  0 represents "unable to perform." 10 represents "able to perform at prior level. 0 1 2 3 4 5 6 7 8 9  10 (Date and Score)   Activity Eval     1.   Moving first thing in the morning 6/10    2.  Standing for long periods of time 7/10    3.  Prolonged sitting 6/10   4.    5.    Score 6.67 average    Total score = sum of the activity scores/number of activities Minimum detectable change (90%CI) for average score = 2 points Minimum detectable change (90%CI) for single activity score = 3 points     COGNITION: Overall cognitive status: Within functional limits for tasks assessed     SENSATION: No complaints of peripheral pain or paresthesias  MUSCLE LENGTH: Hamstrings: Right 45 deg; Left 45 deg  POSTURE: Rrounded shoulders, forward head, and decreased lumbar lordosis  LUMBAR ROM:   AROM 08/22/2023  Flexion   Extension 10  Right lateral flexion   Left lateral flexion   Right rotation   Left rotation    (Blank rows = not tested)  LOWER EXTREMITY ROM:     Passive  Left/Right 08/22/2022   Hip flexion 100/100   Hip extension    Hip abduction    Hip adduction    Hip internal rotation 12/20   Hip external rotation 20/20   Knee flexion    Knee extension    Ankle dorsiflexion    Ankle plantarflexion    Ankle inversion    Ankle eversion     (Blank rows = not tested)  Strength: Deferred secondary to difficulty in obtaining test positions  MMT Right eval Left eval  Hip flexion    Hip extension    Hip abduction    Hip adduction    Hip internal rotation    Hip external rotation    Knee flexion    Knee extension    Ankle dorsiflexion    Ankle plantarflexion    Ankle inversion    Ankle eversion     (Blank rows = not tested)  GAIT: Distance walked: 50 feet Assistive device utilized: None Level of  assistance: Complete Independence Comments: Mallory Charles notes she is okay to walk, but she has difficulty with prolonged sitting and standing, particularly if standing involves flexion  TREATMENT DATE:  08/22/2023 Standing lumbar extension AROM 10 x 3 seconds with hands on gluteals Standing shoulder blade pinches/scapular  retraction 10 x 5 seconds Supine figure 4 stretch 4 x 20 seconds bilateral  [id number]: Reviewed imaging with the spine model; discussed postural and body mechanics basics including a golfers and diagonal squat lift; reviewed examination findings and day 1 home exercise program                                                                                                                        PATIENT EDUCATION:  Education details: See above Person educated: Patient Education method: Explanation, Demonstration, Tactile cues, Verbal cues, and Handouts Education comprehension: verbalized understanding, returned demonstration, verbal cues required, tactile cues required, and needs further education  HOME EXERCISE PROGRAM: Access Code: [email or web address] URL: [email or web address] Date: 08/22/2023 Prepared by: Terral Ferrari  Exercises - Standing Lumbar Extension at Wall - Forearms  - 5 x daily - 7 x weekly - 1 sets - 5 reps - 3 seconds hold - Standing Scapular Retraction  - 5 x daily - 7 x weekly - 1 sets - 5 reps - 5 second hold - Supine Figure 4 Piriformis Stretch  - 2-3 x daily - 7 x weekly - 1 sets - 5 reps - 20 seconds hold  ASSESSMENT:  CLINICAL IMPRESSION: Patient is a 68 y.o. female who was seen today for physical therapy evaluation and treatment for  Diagnosis  M54.41,G89.29 (ICD-10-CM) - Chronic right-sided low back pain with right-sided sciatica  .  Mallory Charles notes she has a history of low back and gluteal pain that has worsened over the past few months, since she retired.  Mallory Charles would like to be more physically active and by addressing limitations in lumbar active range of motion, hip flexibility and core strength, Mallory Charles should be able to meet the below listed goals.  OBJECTIVE IMPAIRMENTS: Abnormal gait, cardiopulmonary status limiting activity, decreased activity tolerance, decreased endurance, decreased knowledge of condition, difficulty walking, decreased ROM, decreased  strength, decreased safety awareness, impaired perceived functional ability, impaired flexibility, improper body mechanics, postural dysfunction, obesity, and pain.   ACTIVITY LIMITATIONS: lifting, bending, sitting, standing, sleeping, stairs, transfers, bed mobility, and locomotion level  PARTICIPATION LIMITATIONS: cleaning and community activity  PERSONAL FACTORS: 1 kidney, CAD, DM Type 2, previous knee surgery and rotator cuff repair are also affecting patient's functional outcome.   REHAB POTENTIAL: Good  CLINICAL DECISION MAKING: Evolving/moderate complexity  EVALUATION COMPLEXITY: Moderate   GOALS: Goals reviewed with patient? Yes  SHORT TERM GOALS: Target date: 09/19/2023  Mallory Charles will be independent with her day 1 home exercise program Baseline: Started 08/22/2023 Goal status: INITIAL  2.  Improve bilateral hip ER AROM to at least 30 degrees Baseline: 20 degrees Goal status: INITIAL  3.  Mallory Charles will have an improved postural awareness and will  be able to implement this into household and ADL activities Baseline: Benefited from education at evaluation Goal status: INITIAL   LONG TERM GOALS: Target date: 10/17/2023  Improve patient's specific functional score to 9/10 Baseline: 6.67 Goal status: INITIAL  2.  Mallory Charles report low back and bilateral gluteal pain no greater than 3/10 on the numeric pain rating scale Baseline: 5/10 Goal status: INITIAL  3.  Improve bilateral hip ER AROM to at least 40 degrees Baseline: 20 degrees Goal status: INITIAL  4.  Mallory Charles will be able to walk for 20+ minutes continuously 3 days a week without increasing low back pain Baseline: Not completing at evaluation Goal status: INITIAL  5.  Mallory Charles will be independent and compliant with her maintenance home exercise program at discharge Baseline: Started 08/22/2023 Goal status: INITIAL  PLAN:  PT FREQUENCY: 1-2x/week  PT DURATION: 8 weeks  PLANNED INTERVENTIONS: 97110-Therapeutic exercises,  97530- Therapeutic activity, 97112- Neuromuscular re-education, 97535- Self Care, 91478- Manual therapy, [id number]- Gait training, [phone]- Traction (mechanical), [phone] (1-2 muscles), 20561 (3+ muscles)- Dry Needling, Patient/Family education, Stair training, Spinal mobilization, and Cryotherapy.  PLAN FOR NEXT SESSION: Review day 1 home exercises, progress core and lower extremity strength, review of progress postural and body mechanics education   Joli Neas, PT, MPT 08/22/2023, 5:44 PM

## 2023-08-23 ENCOUNTER — Other Ambulatory Visit (INDEPENDENT_AMBULATORY_CARE_PROVIDER_SITE_OTHER): Payer: Self-pay

## 2023-08-23 ENCOUNTER — Encounter: Payer: Self-pay | Admitting: Orthopaedic Surgery

## 2023-08-23 ENCOUNTER — Ambulatory Visit: Admitting: Orthopaedic Surgery

## 2023-08-23 DIAGNOSIS — M5441 Lumbago with sciatica, right side: Secondary | ICD-10-CM | POA: Diagnosis not present

## 2023-08-23 DIAGNOSIS — G8929 Other chronic pain: Secondary | ICD-10-CM | POA: Diagnosis not present

## 2023-08-23 NOTE — Progress Notes (Signed)
 The patient is following up as a relates to low back pain.  Her radicular symptoms continue to be gone and most of her pain is in the lower aspect of her lumbar spine more to the right than the left.  She had her first physical therapy session yesterday.  She does have moderate to severe stenosis of the lower aspect of her lumbar spine but again her radicular symptoms have dissipated.  She has excellent strength in the bilateral lower extremities today and normal sensation.  She has negative straight leg raise.  She gets up from a chair easily.  She is an active and young appearing 68 year old female.  Most of her pain seems to be in the facet joint areas of her lower lumbar spine.  She will continue outpatient physical therapy.  I have encouraged her to pick up the MRI on a disk from where she had the imaging studies performed so if she ends up needing to have any type of intervention such as facet joint injections or even an epidural steroid injection that we can get those images to the appropriate person.  Since she is doing so much better, we will have her just complete her physical therapy and follow-up as needed unless things worsen.

## 2023-09-11 ENCOUNTER — Encounter: Payer: Self-pay | Admitting: Physical Therapy

## 2023-09-11 ENCOUNTER — Ambulatory Visit (INDEPENDENT_AMBULATORY_CARE_PROVIDER_SITE_OTHER): Admitting: Physical Therapy

## 2023-09-11 DIAGNOSIS — M6281 Muscle weakness (generalized): Secondary | ICD-10-CM

## 2023-09-11 DIAGNOSIS — M5459 Other low back pain: Secondary | ICD-10-CM | POA: Diagnosis not present

## 2023-09-11 DIAGNOSIS — R293 Abnormal posture: Secondary | ICD-10-CM

## 2023-09-11 NOTE — Therapy (Signed)
 OUTPATIENT PHYSICAL THERAPY TREATMENT   Patient Name: Mallory Charles MRN: [id number] DOB:02/15/1956, 68 y.o., female Today's Date: 09/11/2023  END OF SESSION:  PT End of Session - 09/11/23 1151     Visit Number 2    Number of Visits 16    Date for PT Re-Evaluation 10/17/23    Progress Note Due on Visit 10    PT Start Time 1151    PT Stop Time 1230    PT Time Calculation (min) 39 min    Activity Tolerance Patient tolerated treatment well;No increased pain    Behavior During Therapy WFL for tasks assessed/performed           Past Medical History:  Diagnosis Date   ADD (attention deficit disorder)    Congenital absence of one kidney    Coronary artery disease    Depression    Diabetes mellitus without complication (HCC)    Edema of both lower extremities    Fatty liver    GERD (gastroesophageal reflux disease)    Hyperlipidemia    Hypertension    IBS (irritable bowel syndrome)    Incontinence    Kidney problem    Palpitation    Rapid heart beat    Rosacea    Vertigo    Past Surgical History:  Procedure Laterality Date   ABDOMINAL HYSTERECTOMY     APPENDECTOMY     CHOLECYSTECTOMY     KNEE SURGERY     LAPAROSCOPIC GASTRIC BANDING     ROTATOR CUFF REPAIR     TONSILLECTOMY     Patient Active Problem List   Diagnosis Date Noted   Current mild episode of major depressive disorder without prior episode (HCC) 04/12/2021   Constipation 10/20/2020   Depression 10/20/2020   Diabetes mellitus (HCC) 10/20/2020   Class 3 severe obesity with serious comorbidity and body mass index (BMI) of 45.0 to 49.9 in adult 10/20/2020    PCP: Butler Necessary, MD  REFERRING PROVIDER: Lonni GRADE. Vernetta, MD  REFERRING DIAG:  Diagnosis  M54.41,G89.29 (ICD-10-CM) - Chronic right-sided low back pain with right-sided sciatica    Rationale for Evaluation and Treatment: Rehabilitation  THERAPY DIAG:  Abnormal posture  Muscle weakness (generalized)  Other low back  pain  ONSET DATE: Chronic, worsening over the past few months  SUBJECTIVE:                                                                                                                                                                                           SUBJECTIVE STATEMENT: Pt states she went to the pool and walked. Did some of the exercises but keeps forgetting  in the morning.   PERTINENT HISTORY:  1 kidney, CAD, DM Type 2, previous knee surgery and rotator cuff repair  PAIN:  Are you having pain? Yes: NPRS scale: 2 currently. Low back and gluteal pain as high as 5/10 over the past week Pain location: Lower back and gluteal Pain description: Stiffness in the AM, ache in the PM Aggravating factors: Morning stiffness, prolonged postures Relieving factors: NA  PRECAUTIONS: Back  RED FLAGS: None   WEIGHT BEARING RESTRICTIONS: No  FALLS:  Has patient fallen in last 6 months? 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  DIAGNOSTIC FINDINGS:  Grade 1-2 L5-S1 spondylolisthesis, severe left and moderate to severe right foraminal stenosis  PATIENT SURVEYS:  Patient-Specific Activity Scoring Scheme  0 represents "unable to perform." 10 represents "able to perform at prior level. 0 1 2 3 4 5 6 7 8 9  10 (Date and Score)   Activity Eval     1.  Moving first thing in the morning 6/10    2.  Standing for long periods of time 7/10    3.  Prolonged sitting 6/10   4.    5.    Score 6.67 average    Total score = sum of the activity scores/number of activities Minimum detectable change  (90%CI) for average score = 2 points Minimum detectable change (90%CI) for single activity score = 3 points     COGNITION: Overall cognitive status: Within functional limits for tasks assessed     SENSATION: No complaints of peripheral pain or paresthesias  MUSCLE LENGTH: Hamstrings: Right 45 deg; Left 45 deg  POSTURE: Rrounded shoulders, forward head, and decreased lumbar lordosis  LUMBAR ROM:   AROM 08/22/2023  Flexion   Extension 10  Right lateral flexion   Left lateral flexion   Right rotation   Left rotation    (Blank rows = not tested)  LOWER EXTREMITY ROM:     Passive  Left/Right 08/22/2022   Hip flexion 100/100   Hip extension    Hip abduction    Hip adduction    Hip internal rotation 12/20   Hip external rotation 20/20   Knee flexion    Knee extension    Ankle dorsiflexion    Ankle plantarflexion    Ankle inversion    Ankle eversion     (Blank rows = not tested)  Strength: Deferred secondary to difficulty in obtaining test positions  MMT Right eval Left eval  Hip flexion    Hip extension    Hip abduction    Hip adduction    Hip internal rotation    Hip external rotation    Knee flexion    Knee extension    Ankle dorsiflexion    Ankle plantarflexion    Ankle inversion    Ankle eversion     (Blank rows = not tested)  GAIT: Distance walked: 50 feet Assistive device utilized: None Level of assistance: Complete Independence Comments: Mallory Charles notes she is okay to walk, but she has difficulty with prolonged sitting and standing, particularly if standing involves flexion  TREATMENT DATE:  09/11/2023 Supine LTR 10x5 Supine figure 4 stretch 2x30 Supine piriformis  stretch 2x30 Supine hamstring stretch with strap x30, ITB stretch with strap x30 Supine TSA contraction 2x10 Supine yoga bridge 2x10x3 S/L hip abd 2x10 Standing lumbar ext x10 with hands on glutes Standing shoulder blade pinches/scapular retraction 10 x 5 seconds Standing QL  stretch x 30  08/22/2023 Standing lumbar extension AROM 10 x 3 seconds with hands on gluteals Standing shoulder blade pinches/scapular retraction 10 x 5 seconds Supine figure 4 stretch 4 x 20 seconds bilateral  02464: Reviewed imaging with the spine model; discussed postural and body mechanics basics including a golfers and diagonal squat lift; reviewed examination findings and day 1 home exercise program                                                                                                                        PATIENT EDUCATION:  Education details: See above Person educated: Patient Education method: Explanation, Demonstration, Tactile cues, Verbal cues, and Handouts Education comprehension: verbalized understanding, returned demonstration, verbal cues required, tactile cues required, and needs further education  HOME EXERCISE PROGRAM: Access Code: [email or web address] URL: [email or web address] Date: 09/11/2023 Prepared by: Romeo Zielinski April Earnie Starring  Exercises - Standing Lumbar Extension at [employer] - Forearms  - 5 x daily - 7 x weekly - 1 sets - 5 reps - 3 seconds hold - Standing Scapular Retraction  - 5 x daily - 7 x weekly - 1 sets - 5 reps - 5 second hold - Supine Figure 4 Piriformis Stretch  - 2-3 x daily - 7 x weekly - 1 sets - 5 reps - 20 seconds hold - Yoga Bridge  - 1 x daily - 7 x weekly - 2 sets - 10 reps - Sidelying Hip Abduction  - 1 x daily - 7 x weekly - 2 sets - 10 reps  ASSESSMENT:  CLINICAL IMPRESSION: Reviewed pt's HEP. Added core and hip strengthening this session with good pt tolerance.   From eval: Patient is a 68 y.o. female who was seen today for physical therapy evaluation and treatment for  Diagnosis  M54.41,G89.29 (ICD-10-CM) - Chronic right-sided low back pain with right-sided sciatica  .  Mallory Charles notes she has a history of low back and gluteal pain that has worsened over the past few months, since she retired.  Mallory Charles would like to be more  physically active and by addressing limitations in lumbar active range of motion, hip flexibility and core strength, Mallory Charles should be able to meet the below listed goals.  OBJECTIVE IMPAIRMENTS: Abnormal gait, cardiopulmonary status limiting activity, decreased activity tolerance, decreased endurance, decreased knowledge of condition, difficulty walking, decreased ROM, decreased strength, decreased safety awareness, impaired perceived functional ability, impaired flexibility, improper body mechanics, postural dysfunction, obesity, and pain.   ACTIVITY LIMITATIONS: lifting, bending, sitting, standing, sleeping, stairs, transfers, bed mobility, and locomotion level  PARTICIPATION LIMITATIONS: cleaning and community activity  PERSONAL FACTORS: 1 kidney, CAD, DM Type 2, previous knee surgery and rotator cuff repair are also affecting patient's functional outcome.   REHAB  POTENTIAL: Good  CLINICAL DECISION MAKING: Evolving/moderate complexity  EVALUATION COMPLEXITY: Moderate   GOALS: Goals reviewed with patient? Yes  SHORT TERM GOALS: Target date: 09/19/2023  Mallory Charles will be independent with her day 1 home exercise program Baseline: Started 08/22/2023 Goal status: INITIAL  2.  Improve bilateral hip ER AROM to at least 30 degrees Baseline: 20 degrees Goal status: INITIAL  3.  Mallory Charles will have an improved postural awareness and will be able to implement this into household and ADL activities Baseline: Benefited from education at evaluation Goal status: INITIAL   LONG TERM GOALS: Target date: 10/17/2023  Improve patient's specific functional score to 9/10 Baseline: 6.67 Goal status: INITIAL  2.  Mallory Charles report low back and bilateral gluteal pain no greater than 3/10 on the numeric pain rating scale Baseline: 5/10 Goal status: INITIAL  3.  Improve bilateral hip ER AROM to at least 40 degrees Baseline: 20 degrees Goal status: INITIAL  4.  Mallory Charles will be able to walk for 20+ minutes  continuously 3 days a week without increasing low back pain Baseline: Not completing at evaluation Goal status: INITIAL  5.  Mallory Charles will be independent and compliant with her maintenance home exercise program at discharge Baseline: Started 08/22/2023 Goal status: INITIAL  PLAN:  PT FREQUENCY: 1-2x/week  PT DURATION: 8 weeks  PLANNED INTERVENTIONS: 97110-Therapeutic exercises, 97530- Therapeutic activity, 97112- Neuromuscular re-education, 97535- Self Care, [id number]- Manual therapy, [id number]- Gait training, [phone]- Traction (mechanical), [phone] (1-2 muscles), 20561 (3+ muscles)- Dry Needling, Patient/Family education, Stair training, Spinal mobilization, and Cryotherapy.  PLAN FOR NEXT SESSION: Review day 1 home exercises, progress core and lower extremity strength, review of progress postural and body mechanics education   Kourtnie Sachs April Ma L Mallory Charles, PT, DPT 09/11/2023, 11:52 AM

## 2023-09-13 ENCOUNTER — Encounter: Payer: Self-pay | Admitting: Rehabilitative and Restorative Service Providers"

## 2023-09-13 ENCOUNTER — Ambulatory Visit: Admitting: Rehabilitative and Restorative Service Providers"

## 2023-09-13 DIAGNOSIS — M5459 Other low back pain: Secondary | ICD-10-CM | POA: Diagnosis not present

## 2023-09-13 DIAGNOSIS — R293 Abnormal posture: Secondary | ICD-10-CM

## 2023-09-13 DIAGNOSIS — M6281 Muscle weakness (generalized): Secondary | ICD-10-CM

## 2023-09-13 NOTE — Therapy (Signed)
 OUTPATIENT PHYSICAL THERAPY TREATMENT   Patient Name: Mallory Charles MRN: [id number] DOB:04-30-1955, 68 y.o., female Today's Date: 09/13/2023  END OF SESSION:  PT End of Session - 09/13/23 1427     Visit Number 3    Number of Visits 16    Date for PT Re-Evaluation 10/17/23    Progress Note Due on Visit 10    PT Start Time 1427    PT Stop Time 1508    PT Time Calculation (min) 41 min    Activity Tolerance Patient tolerated treatment well;No increased pain    Behavior During Therapy WFL for tasks assessed/performed            Past Medical History:  Diagnosis Date   ADD (attention deficit disorder)    Congenital absence of one kidney    Coronary artery disease    Depression    Diabetes mellitus without complication (HCC)    Edema of both lower extremities    Fatty liver    GERD (gastroesophageal reflux disease)    Hyperlipidemia    Hypertension    IBS (irritable bowel syndrome)    Incontinence    Kidney problem    Palpitation    Rapid heart beat    Rosacea    Vertigo    Past Surgical History:  Procedure Laterality Date   ABDOMINAL HYSTERECTOMY     APPENDECTOMY     CHOLECYSTECTOMY     KNEE SURGERY     LAPAROSCOPIC GASTRIC BANDING     ROTATOR CUFF REPAIR     TONSILLECTOMY     Patient Active Problem List   Diagnosis Date Noted   Current mild episode of major depressive disorder without prior episode (HCC) 04/12/2021   Constipation 10/20/2020   Depression 10/20/2020   Diabetes mellitus (HCC) 10/20/2020   Class 3 severe obesity with serious comorbidity and body mass index (BMI) of 45.0 to 49.9 in adult 10/20/2020    PCP: Butler Necessary, MD  REFERRING PROVIDER: Lonni GRADE. Vernetta, MD  REFERRING DIAG:  Diagnosis  M54.41,G89.29 (ICD-10-CM) - Chronic right-sided low back pain with right-sided sciatica    Rationale for Evaluation and Treatment: Rehabilitation  THERAPY DIAG:  Abnormal posture  Muscle weakness (generalized)  Other low back  pain  ONSET DATE: Chronic, worsening over the past few months  SUBJECTIVE:                                                                                                                                                                                           SUBJECTIVE STATEMENT: Mallory Charles reports fair HEP compliance.  She did not need any pain meds this week.  Standing and walking are particularly limiting.    PERTINENT HISTORY:  1 kidney, CAD, DM Type 2, previous knee surgery and rotator cuff repair  PAIN:  Are you having pain? Yes: NPRS scale: Low back and gluteal pain as high as 3-4/10 (was 5/10) over the past week Pain location: Lower back and gluteal Pain description: Stiffness in the AM, ache in the PM Aggravating factors: Morning stiffness, prolonged postures Relieving factors: NA  PRECAUTIONS: Back  RED FLAGS: None   WEIGHT BEARING RESTRICTIONS: No  FALLS:  Has patient fallen in last 6 months? 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  DIAGNOSTIC FINDINGS:  Grade 1-2 L5-S1 spondylolisthesis, severe left and moderate to severe right foraminal stenosis  PATIENT SURVEYS:  Patient-Specific Activity Scoring Scheme  0 represents "unable to perform." 10 represents "able to perform at prior level. 0 1 2 3 4 5 6 7 8 9  10 (Date and Score)   Activity Eval     1.  Moving first thing in the morning 6/10    2.  Standing for long periods of time 7/10    3.  Prolonged sitting 6/10   4.    5.    Score 6.67 average    Total score = sum of the activity scores/number of activities Minimum  detectable change (90%CI) for average score = 2 points Minimum detectable change (90%CI) for single activity score = 3 points     COGNITION: Overall cognitive status: Within functional limits for tasks assessed     SENSATION: No complaints of peripheral pain or paresthesias  MUSCLE LENGTH: Hamstrings: Right 45 deg; Left 45 deg  POSTURE: Rrounded shoulders, forward head, and decreased lumbar lordosis  LUMBAR ROM:   AROM 08/22/2023 09/13/2023  Flexion    Extension 10 10  Right lateral flexion    Left lateral flexion    Right rotation    Left rotation     (Blank rows = not tested)  LOWER EXTREMITY ROM:     Passive  Left/Right 08/22/2022   Hip flexion 100/100   Hip extension    Hip abduction    Hip adduction    Hip internal rotation 12/20   Hip external rotation 20/20   Knee flexion    Knee extension    Ankle dorsiflexion    Ankle plantarflexion    Ankle inversion    Ankle eversion     (Blank rows = not tested)  Strength: Deferred secondary to difficulty in obtaining test positions  MMT Right eval Left eval  Hip flexion    Hip extension    Hip abduction    Hip adduction    Hip internal rotation    Hip external rotation    Knee flexion    Knee extension    Ankle dorsiflexion    Ankle plantarflexion    Ankle inversion    Ankle eversion     (Blank rows = not tested)  GAIT: Distance walked: 50 feet Assistive device utilized: None Level of assistance: Complete Independence Comments: Mallory Charles notes she is okay to walk, but she has difficulty with prolonged sitting and standing, particularly if standing involves flexion  TREATMENT DATE:  09/13/2023 Standing lumbar extension AROM 10 x 3 seconds with hands on gluteals Standing shoulder blade pinches/scapular retraction 10 x 5 seconds Supine figure 4 stretch 4 x 20 seconds bilateral Yoga Bridge 2 sets of 10 for 5 seconds Side lie hip abduction (1/4 turn to stomach) 10 x 3 seconds each side Hip hike at counter top  2 sets of 10 for 3 seconds  02464: Practical log roll; practical golfer's lift; practical diagonal squat lift   09/11/2023 Supine LTR 10x5 Supine figure 4 stretch 2x30 Supine piriformis stretch 2x30 Supine hamstring stretch with strap x30, ITB stretch with strap x30 Supine TSA contraction 2x10 Supine yoga bridge 2x10x3 S/L hip abd 2x10 Standing lumbar ext x10 with hands on glutes Standing shoulder blade pinches/scapular retraction 10 x 5 seconds Standing QL stretch x 30   08/22/2023 Standing lumbar extension AROM 10 x 3 seconds with hands on gluteals Standing shoulder blade pinches/scapular retraction 10 x 5 seconds Supine figure 4 stretch 4 x 20 seconds bilateral  [id number]: Reviewed imaging with the spine model; discussed postural and body mechanics basics including a golfers and diagonal squat lift; reviewed examination findings and day 1 home exercise program                                                                                                                        PATIENT EDUCATION:  Education details: See above Person educated: Patient Education method: Explanation, Demonstration, Tactile cues, Verbal cues, and Handouts Education comprehension: verbalized understanding, returned demonstration, verbal cues required, tactile cues required, and needs further education  HOME EXERCISE PROGRAM: Access Code: [email or web address] URL: [email or web address] Date: 09/13/2023 Prepared by: Lamar Ivory  Exercises - Standing Lumbar Extension at Wall - Forearms  - 5 x daily - 7 x weekly - 1 sets - 5 reps - 3 seconds hold - Standing Scapular Retraction  - 5 x daily - 7 x weekly - 1 sets - 5 reps - 5 second hold - Supine Figure 4 Piriformis Stretch  - 2-3 x daily - 7 x weekly - 1 sets - 5 reps - 20 seconds hold - Yoga Bridge  - 1 x daily - 7 x weekly - 2 sets - 10 reps - 5 seconds hold - Standing Hip Hiking  - 3-5 x daily - 7 x weekly - 1 sets - 10 reps - 3 seconds  hold  ASSESSMENT:  CLINICAL IMPRESSION: Mallory Charles did a good job with recall and demonstration of her day 1 home exercises.  We progressed some spine strength activities for lumbar paraspinals, hip abductors and quadratus lumborum.  We also spent quite a bit of time reviewing practical body mechanics for bed mobility and lifting techniques.  Mallory Charles is particularly interested in improving her standing and walking endurance along with improving her pain.  Her prognosis remains good with the recommended plan of care.  From eval: Patient is a 68 y.o. female who was seen today for physical therapy evaluation and treatment for  Diagnosis  M54.41,G89.29 (ICD-10-CM) - Chronic right-sided low back pain with right-sided sciatica  .  Mallory Charles notes she has a history of low back and gluteal pain that has worsened over the past few months, since she retired.  Mallory Charles would like to be more physically active and by addressing limitations in lumbar active range of motion, hip flexibility and core strength, Mallory Charles should be able to meet the below listed goals.  OBJECTIVE IMPAIRMENTS: Abnormal gait, cardiopulmonary status limiting activity, decreased activity tolerance, decreased endurance, decreased knowledge of condition, difficulty walking, decreased ROM, decreased strength, decreased safety awareness, impaired perceived functional ability, impaired flexibility, improper body mechanics, postural dysfunction, obesity, and pain.   ACTIVITY LIMITATIONS: lifting, bending, sitting, standing, sleeping, stairs, transfers, bed mobility, and locomotion level  PARTICIPATION LIMITATIONS: cleaning and community activity  PERSONAL FACTORS: 1 kidney, CAD, DM Type 2, previous knee surgery and rotator cuff repair are also affecting patient's functional outcome.   REHAB POTENTIAL: Good  CLINICAL DECISION MAKING: Evolving/moderate complexity  EVALUATION COMPLEXITY: Moderate   GOALS: Goals reviewed with patient? Yes  SHORT TERM  GOALS: Target date: 09/19/2023  Mallory Charles will be independent with her day 1 home exercise program Baseline: Started 08/22/2023 Goal status: Met 09/13/2023  2.  Improve bilateral hip ER AROM to at least 30 degrees Baseline: 20 degrees Goal status: INITIAL  3.  Mallory Charles will have an improved postural awareness and will be able to implement this into household and ADL activities Baseline: Benefited from education at evaluation Goal status: Ongoing 09/13/2023   LONG TERM GOALS: Target date: 10/17/2023  Improve patient's specific functional score to 9/10 Baseline: 6.67 Goal status: INITIAL  2.  Mallory Charles report low back and bilateral gluteal pain no greater than 3/10 on the numeric pain rating scale Baseline: 5/10 Goal status: Ongoing 09/13/2023  3.  Improve bilateral hip ER AROM to at least 40 degrees Baseline: 20 degrees Goal status: INITIAL  4.  Mallory Charles will be able to walk for 20+ minutes continuously 3 days a week without increasing low back pain Baseline: Not completing at evaluation Goal status: Ongoing 09/13/2023  5.  Mallory Charles will be independent and compliant with her maintenance home exercise program at discharge Baseline: Started 08/22/2023 Goal status: INITIAL  PLAN:  PT FREQUENCY: 1-2x/week  PT DURATION: 8 weeks  PLANNED INTERVENTIONS: 97110-Therapeutic exercises, 97530- Therapeutic activity, 97112- Neuromuscular re-education, 97535- Self Care, 02859- Manual therapy, [id number]- Gait training, [phone]- Traction (mechanical), [phone] (1-2 muscles), [phone] (3+ muscles)- Dry Needling, Patient/Family education, Stair training, Spinal mobilization, and Cryotherapy.  PLAN FOR NEXT SESSION: Review current home exercises, progress low back and lower extremity strength, review of practical postural and body mechanics education.   Myer LELON Ivory, PT, MPT 09/13/2023, 3:24 PM

## 2023-09-19 ENCOUNTER — Encounter: Payer: Self-pay | Admitting: Rehabilitative and Restorative Service Providers"

## 2023-09-19 ENCOUNTER — Ambulatory Visit (INDEPENDENT_AMBULATORY_CARE_PROVIDER_SITE_OTHER): Admitting: Rehabilitative and Restorative Service Providers"

## 2023-09-19 DIAGNOSIS — M6281 Muscle weakness (generalized): Secondary | ICD-10-CM | POA: Diagnosis not present

## 2023-09-19 DIAGNOSIS — M5459 Other low back pain: Secondary | ICD-10-CM

## 2023-09-19 DIAGNOSIS — R293 Abnormal posture: Secondary | ICD-10-CM

## 2023-09-19 NOTE — Therapy (Signed)
 OUTPATIENT PHYSICAL THERAPY TREATMENT   Patient Name: Mallory Charles MRN: [id number] DOB:03/08/1956, 68 y.o., female Today's Date: 09/19/2023  END OF SESSION:  PT End of Session - 09/19/23 1301     Visit Number 4    Number of Visits 16    Date for PT Re-Evaluation 10/17/23    Progress Note Due on Visit 10    PT Start Time 1301    PT Stop Time 1343    PT Time Calculation (min) 42 min    Activity Tolerance Patient tolerated treatment well;No increased pain    Behavior During Therapy WFL for tasks assessed/performed             Past Medical History:  Diagnosis Date   ADD (attention deficit disorder)    Congenital absence of one kidney    Coronary artery disease    Depression    Diabetes mellitus without complication (HCC)    Edema of both lower extremities    Fatty liver    GERD (gastroesophageal reflux disease)    Hyperlipidemia    Hypertension    IBS (irritable bowel syndrome)    Incontinence    Kidney problem    Palpitation    Rapid heart beat    Rosacea    Vertigo    Past Surgical History:  Procedure Laterality Date   ABDOMINAL HYSTERECTOMY     APPENDECTOMY     CHOLECYSTECTOMY     KNEE SURGERY     LAPAROSCOPIC GASTRIC BANDING     ROTATOR CUFF REPAIR     TONSILLECTOMY     Patient Active Problem List   Diagnosis Date Noted   Current mild episode of major depressive disorder without prior episode (HCC) 04/12/2021   Constipation 10/20/2020   Depression 10/20/2020   Diabetes mellitus (HCC) 10/20/2020   Class 3 severe obesity with serious comorbidity and body mass index (BMI) of 45.0 to 49.9 in adult 10/20/2020    PCP: Butler Necessary, MD  REFERRING PROVIDER: Lonni GRADE. Vernetta, MD  REFERRING DIAG:  Diagnosis  M54.41,G89.29 (ICD-10-CM) - Chronic right-sided low back pain with right-sided sciatica    Rationale for Evaluation and Treatment: Rehabilitation  THERAPY DIAG:  Abnormal posture  Muscle weakness (generalized)  Other low back  pain  ONSET DATE: Chronic, worsening over the past few months  SUBJECTIVE:                                                                                                                                                                                           SUBJECTIVE STATEMENT: [name] reports improved HEP compliance as compared to last week.  She also did not  need any pain meds this week.  Standing and walking are particularly limiting.  Most walking for exercise was in the pool.  PERTINENT HISTORY:  1 kidney, CAD, DM Type 2, previous knee surgery and rotator cuff repair  PAIN:  Are you having pain? Yes: NPRS scale: Low back and gluteal pain as high as 0-2/10 (was 5/10) over the past week Pain location: Lower back and gluteal Pain description: Stiffness in the AM, ache in the PM Aggravating factors: Morning stiffness, prolonged postures Relieving factors: NA  PRECAUTIONS: Back  RED FLAGS: None   WEIGHT BEARING RESTRICTIONS: No  FALLS:  Has patient fallen in last 6 months? 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  DIAGNOSTIC FINDINGS:  Grade 1-2 L5-S1 spondylolisthesis, severe left and moderate to severe right foraminal stenosis  PATIENT SURVEYS:  Patient-Specific Activity Scoring Scheme  0 represents "unable to perform." 10 represents "able to perform at prior level. 0 1 2 3 4 5 6 7 8 9  10 (Date and Score)   Activity Eval   09/19/2023  1.  Moving first thing in the morning 6/10 9/10   2.  Standing for long periods of time 7/10 7/10   3.  Prolonged sitting 6/10 7/10  4.    5.     Score 6.67 average 7.67   Total score = sum of the activity scores/number of activities Minimum detectable change (90%CI) for average score = 2 points Minimum detectable change (90%CI) for single activity score = 3 points     COGNITION: Overall cognitive status: Within functional limits for tasks assessed     SENSATION: No complaints of peripheral pain or paresthesias  MUSCLE LENGTH: 09/19/2023: Hamstrings: Right 55 deg; Left 50 deg  Eval: Hamstrings: Right 45 deg; Left 45 deg  POSTURE: Rrounded shoulders, forward head, and decreased lumbar lordosis  LUMBAR ROM:   AROM 08/22/2023 09/13/2023  Flexion    Extension 10 10  Right lateral flexion    Left lateral flexion    Right rotation    Left rotation     (Blank rows = not tested)  LOWER EXTREMITY ROM:     Passive  Left/Right 08/22/2022 Left/Right 09/19/2023  Hip flexion 100/100 105/110  Hip extension    Hip abduction    Hip adduction    Hip internal rotation 12/20 20/22  Hip external rotation 20/20 30/32  Knee flexion    Knee extension    Ankle dorsiflexion    Ankle plantarflexion    Ankle inversion    Ankle eversion     (Blank rows = not tested)  Strength: Deferred secondary to difficulty in obtaining test positions  MMT Right eval Left eval  Hip flexion    Hip extension    Hip abduction    Hip adduction    Hip internal rotation    Hip external rotation    Knee flexion    Knee extension    Ankle dorsiflexion    Ankle plantarflexion    Ankle inversion    Ankle eversion     (Blank rows = not tested)  GAIT: Distance walked: 50 feet Assistive device utilized: None Level of assistance: Complete Independence Comments:  Graysen notes she is okay to walk, but she has difficulty with prolonged sitting and standing, particularly if standing involves flexion  TREATMENT DATE: 09/19/2023 Standing lumbar extension AROM 10 x 3 seconds with hands on gluteals Standing shoulder blade pinches/scapular retraction 10 x 5  seconds Supine figure 4 stretch 4 x 20 seconds bilateral Yoga Bridge 10 for 5 seconds Prone alternating hip extension 10 x 3 seconds Superwoman 60 seconds Hip hike at counter top 2 sets of 10 for 3 seconds  02464: Practical log roll; practical golfer's lift; practical diagonal squat and modified diagonal squat lift   09/13/2023 Standing lumbar extension AROM 10 x 3 seconds with hands on gluteals Standing shoulder blade pinches/scapular retraction 10 x 5 seconds Supine figure 4 stretch 4 x 20 seconds bilateral Yoga Bridge 2 sets of 10 for 5 seconds Side lie hip abduction (1/4 turn to stomach) 10 x 3 seconds each side Hip hike at counter top 2 sets of 10 for 3 seconds  02464: Practical log roll; practical golfer's lift; practical diagonal squat lift   09/11/2023 Supine LTR 10x5 Supine figure 4 stretch 2x30 Supine piriformis stretch 2x30 Supine hamstring stretch with strap x30, ITB stretch with strap x30 Supine TSA contraction 2x10 Supine yoga bridge 2x10x3 S/L hip abd 2x10 Standing lumbar ext x10 with hands on glutes Standing shoulder blade pinches/scapular retraction 10 x 5 seconds Standing QL stretch x 30                                                               PATIENT EDUCATION:  Education details: See above Person educated: Patient Education method: Explanation, Demonstration, Tactile cues, Verbal cues, and Handouts Education comprehension: verbalized understanding, returned demonstration, verbal cues required, tactile cues required, and needs further education  HOME EXERCISE PROGRAM: Access Code: [email or web address] URL: [email or web address] Date: 09/19/2023 Prepared by: Lamar Ivory  Exercises - Standing Lumbar Extension at Wall - Forearms  - 5 x daily - 7 x weekly - 1 sets - 5 reps - 3 seconds hold - Standing Scapular Retraction  - 5 x daily - 7 x weekly - 1 sets - 5 reps - 5 second hold - Supine Figure 4 Piriformis Stretch  - 2 x daily - 7 x weekly  - 1 sets - 5 reps - 20 seconds hold - Yoga Bridge  - 1 x daily - 1 x weekly - 2 sets - 10 reps - 5 seconds hold - Standing Hip Hiking  - 3-5 x daily - 7 x weekly - 1 sets - 10 reps - 3-5 seconds hold - Prone Hip Extension  - 1 x daily - 1 x weekly - 2 sets - 10 reps - 3-5 seconds hold  ASSESSMENT:  CLINICAL IMPRESSION: Gurbani notes significant progress with her pain since starting physical therapy.  We did need to modify a few activities due to knee discomfort.  I mentioned to Chevi that improving her quadriceps strength would be a good idea given her knee arthritis and her reliance on her hips and knees to use body mechanics that are appropriate for her spine.  Shunte did a great job on her spine strength test and appears to be rapidly making progress towards meeting long-term goals.  From eval: Patient is a 68 y.o. female who was seen  today for physical therapy evaluation and treatment for  Diagnosis  M54.41,G89.29 (ICD-10-CM) - Chronic right-sided low back pain with right-sided sciatica  .  Chieko notes she has a history of low back and gluteal pain that has worsened over the past few months, since she retired.  Malayla would like to be more physically active and by addressing limitations in lumbar active range of motion, hip flexibility and core strength, Azyah should be able to meet the below listed goals.  OBJECTIVE IMPAIRMENTS: Abnormal gait, cardiopulmonary status limiting activity, decreased activity tolerance, decreased endurance, decreased knowledge of condition, difficulty walking, decreased ROM, decreased strength, decreased safety awareness, impaired perceived functional ability, impaired flexibility, improper body mechanics, postural dysfunction, obesity, and pain.   ACTIVITY LIMITATIONS: lifting, bending, sitting, standing, sleeping, stairs, transfers, bed mobility, and locomotion level  PARTICIPATION LIMITATIONS: cleaning and community activity  PERSONAL FACTORS: 1 kidney, CAD, DM  Type 2, previous knee surgery and rotator cuff repair are also affecting patient's functional outcome.   REHAB POTENTIAL: Good  CLINICAL DECISION MAKING: Evolving/moderate complexity  EVALUATION COMPLEXITY: Moderate   GOALS: Goals reviewed with patient? Yes  SHORT TERM GOALS: Target date: 09/19/2023  Diasha will be independent with her day 1 home exercise program Baseline: Started 08/22/2023 Goal status: Met 09/13/2023  2.  Improve bilateral hip ER AROM to at least 30 degrees Baseline: 20 degrees Goal status: Met 09/19/2023  3.  Shakyla will have an improved postural awareness and will be able to implement this into household and ADL activities Baseline: Benefited from education at evaluation Goal status: Met 09/19/2023   LONG TERM GOALS: Target date: 10/17/2023  Improve patient's specific functional score to 9/10 Baseline: 6.67 Goal status: On Going 09/19/2023  2.  Chelcie report low back and bilateral gluteal pain no greater than 3/10 on the numeric pain rating scale Baseline: 5/10 Goal status: Met 09/19/2023  3.  Improve bilateral hip ER AROM to at least 40 degrees Baseline: 20 degrees Goal status: On Going 09/19/2023  4.  Donatella will be able to walk for 20+ minutes continuously 3 days a week without increasing low back pain Baseline: Not completing at evaluation Goal status: Partially met, in the pool 09/19/2023  5.  Saori will be independent and compliant with her maintenance home exercise program at discharge Baseline: Started 08/22/2023 Goal status: On Going 09/19/2023  PLAN:  PT FREQUENCY: 1-2x/week  PT DURATION: 8 weeks  PLANNED INTERVENTIONS: 97110-Therapeutic exercises, 97530- Therapeutic activity, 97112- Neuromuscular re-education, 97535- Self Care, 02859- Manual therapy, [id number]- Gait training, [phone]- Traction (mechanical), [phone] (1-2 muscles), 20561 (3+ muscles)- Dry Needling, Patient/Family education, Stair training, Spinal mobilization, and Cryotherapy.  PLAN FOR NEXT  SESSION: Review current home exercises, progress low back and lower extremity/quadriceps strength, review of practical postural and body mechanics education as needed.   Myer LELON Ivory, PT, MPT 09/19/2023, 5:48 PM

## 2023-09-21 ENCOUNTER — Encounter: Payer: Self-pay | Admitting: Rehabilitative and Restorative Service Providers"

## 2023-09-21 ENCOUNTER — Ambulatory Visit (INDEPENDENT_AMBULATORY_CARE_PROVIDER_SITE_OTHER): Admitting: Rehabilitative and Restorative Service Providers"

## 2023-09-21 DIAGNOSIS — M5459 Other low back pain: Secondary | ICD-10-CM

## 2023-09-21 DIAGNOSIS — R293 Abnormal posture: Secondary | ICD-10-CM | POA: Diagnosis not present

## 2023-09-21 DIAGNOSIS — M6281 Muscle weakness (generalized): Secondary | ICD-10-CM | POA: Diagnosis not present

## 2023-09-21 NOTE — Therapy (Signed)
 OUTPATIENT PHYSICAL THERAPY TREATMENT   Patient Name: Mallory Charles MRN: [id number] DOB:February 19, 1956, 68 y.o., female Today's Date: 09/21/2023  END OF SESSION:  PT End of Session - 09/21/23 1016     Visit Number 5    Number of Visits 16    Date for PT Re-Evaluation 10/17/23    Progress Note Due on Visit 10    PT Start Time 1015    PT Stop Time 1057    PT Time Calculation (min) 42 min    Activity Tolerance Patient tolerated treatment well;No increased pain    Behavior During Therapy WFL for tasks assessed/performed              Past Medical History:  Diagnosis Date   ADD (attention deficit disorder)    Congenital absence of one kidney    Coronary artery disease    Depression    Diabetes mellitus without complication (HCC)    Edema of both lower extremities    Fatty liver    GERD (gastroesophageal reflux disease)    Hyperlipidemia    Hypertension    IBS (irritable bowel syndrome)    Incontinence    Kidney problem    Palpitation    Rapid heart beat    Rosacea    Vertigo    Past Surgical History:  Procedure Laterality Date   ABDOMINAL HYSTERECTOMY     APPENDECTOMY     CHOLECYSTECTOMY     KNEE SURGERY     LAPAROSCOPIC GASTRIC BANDING     ROTATOR CUFF REPAIR     TONSILLECTOMY     Patient Active Problem List   Diagnosis Date Noted   Current mild episode of major depressive disorder without prior episode (HCC) 04/12/2021   Constipation 10/20/2020   Depression 10/20/2020   Diabetes mellitus (HCC) 10/20/2020   Class 3 severe obesity with serious comorbidity and body mass index (BMI) of 45.0 to 49.9 in adult 10/20/2020    PCP: Mallory Necessary, MD  REFERRING PROVIDER: Lonni GRADE. Vernetta, MD  REFERRING DIAG:  Diagnosis  M54.41,G89.29 (ICD-10-CM) - Chronic right-sided low back pain with right-sided sciatica    Rationale for Evaluation and Treatment: Rehabilitation  THERAPY DIAG:  Abnormal posture  Muscle weakness (generalized)  Other low back  pain  ONSET DATE: Chronic, worsening over the past few months  SUBJECTIVE:                                                                                                                                                                                           SUBJECTIVE STATEMENT: [name] continues to do a great job with her HEP compliance.  She is  also walking 20+ minutes in the pool 2-3 x a week.  Still no pain meds this week.    PERTINENT HISTORY:  1 kidney, CAD, DM Type 2, previous knee surgery and rotator cuff repair  PAIN:  Are you having pain? Yes: NPRS scale: Low back and gluteal pain as high as 0-2/10 (was 5/10) over the past week Pain location: Lower back and gluteal Pain description: Stiffness in the AM, ache in the PM Aggravating factors: Morning stiffness, prolonged postures Relieving factors: NA  PRECAUTIONS: Back  RED FLAGS: None   WEIGHT BEARING RESTRICTIONS: No  FALLS:  Has patient fallen in last 6 months? 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  DIAGNOSTIC FINDINGS:  Grade 1-2 L5-S1 spondylolisthesis, severe left and moderate to severe right foraminal stenosis  PATIENT SURVEYS:  Patient-Specific Activity Scoring Scheme  0 represents "unable to perform." 10 represents "able to perform at prior level. 0 1 2 3 4 5 6 7 8 9  10 (Date and Score)   Activity Eval   09/19/2023  1.  Moving first thing in the morning 6/10 9/10   2.  Standing for long periods of time 7/10 7/10   3.  Prolonged sitting 6/10 7/10  4.    5.    Score 6.67 average 7.67   Total score = sum of the  activity scores/number of activities Minimum detectable change (90%CI) for average score = 2 points Minimum detectable change (90%CI) for single activity score = 3 points     COGNITION: Overall cognitive status: Within functional limits for tasks assessed     SENSATION: No complaints of peripheral pain or paresthesias  MUSCLE LENGTH: 09/19/2023: Hamstrings: Right 55 deg; Left 50 deg  Eval: Hamstrings: Right 45 deg; Left 45 deg  POSTURE: Rrounded shoulders, forward head, and decreased lumbar lordosis  LUMBAR ROM:   AROM 08/22/2023 09/13/2023  Flexion    Extension 10 10  Right lateral flexion    Left lateral flexion    Right rotation    Left rotation     (Blank rows = not tested)  LOWER EXTREMITY ROM:     Passive  Left/Right 08/22/2022 Left/Right 09/19/2023  Hip flexion 100/100 105/110  Hip extension    Hip abduction    Hip adduction    Hip internal rotation 12/20 20/22  Hip external rotation 20/20 30/32  Knee flexion    Knee extension    Ankle dorsiflexion    Ankle plantarflexion    Ankle inversion    Ankle eversion     (Blank rows = not tested)  Strength: Deferred secondary to difficulty in obtaining test positions  MMT Right eval Left eval  Hip flexion    Hip extension    Hip abduction    Hip adduction    Hip internal rotation    Hip external rotation    Knee flexion    Knee extension    Ankle dorsiflexion    Ankle plantarflexion    Ankle inversion    Ankle eversion     (Blank rows = not tested)  GAIT: Distance walked: 50 feet Assistive device utilized: None Level of assistance: Complete Independence Comments: [name] notes  she is okay to walk, but she has difficulty with prolonged sitting and standing, particularly if standing involves flexion  TREATMENT DATE: 09/21/2023 Standing lumbar extension AROM 5 x 3 seconds with hands on gluteals Standing shoulder blade pinches/scapular retraction 5 x 5 seconds Quadriceps sets 2 sets of 10 for 5  seconds Supine figure 4 stretch 4 x 20 seconds bilateral Yoga Bridge 10 for 5 seconds Prone alternating hip extension 2 sets of 10 x 3 seconds Hip hike at counter top 2 sets of 10 for 3 seconds Seated straight leg raises 2 sets of 5 for 3 seconds  02464: Comprehensive review of body mechanics, knee education (as it relates to lifting techniques and the spine), review updates to HEP   09/19/2023 Standing lumbar extension AROM 10 x 3 seconds with hands on gluteals Standing shoulder blade pinches/scapular retraction 10 x 5 seconds Supine figure 4 stretch 4 x 20 seconds bilateral Yoga Bridge 10 for 5 seconds Prone alternating hip extension 10 x 3 seconds Superwoman 60 seconds Hip hike at counter top 2 sets of 10 for 3 seconds  02464: Practical log roll; practical golfer's lift; practical diagonal squat and modified diagonal squat lift   09/13/2023 Standing lumbar extension AROM 10 x 3 seconds with hands on gluteals Standing shoulder blade pinches/scapular retraction 10 x 5 seconds Supine figure 4 stretch 4 x 20 seconds bilateral Yoga Bridge 2 sets of 10 for 5 seconds Side lie hip abduction (1/4 turn to stomach) 10 x 3 seconds each side Hip hike at counter top 2 sets of 10 for 3 seconds  02464: Practical log roll; practical golfer's lift; practical diagonal squat lift                        PATIENT EDUCATION:  Education details: See above Person educated: Patient Education method: Explanation, Demonstration, Tactile cues, Verbal cues, and Handouts Education comprehension: verbalized understanding, returned demonstration, verbal cues required, tactile cues required, and needs further education  HOME EXERCISE PROGRAM: Access Code: [email or web address] URL: [email or web address] Date: 09/21/2023 Prepared by: Lamar Ivory  Exercises - Standing Lumbar Extension at Wall - Forearms  - 5 x daily - 7 x weekly - 1 sets - 5 reps - 3 seconds hold - Standing Scapular Retraction  - 5 x  daily - 7 x weekly - 1 sets - 5 reps - 5 second hold - Supine Figure 4 Piriformis Stretch  - 2 x daily - 7 x weekly - 1 sets - 5 reps - 20 seconds hold - Yoga Bridge  - 1 x daily - 1 x weekly - 2 sets - 10 reps - 5 seconds hold - Standing Hip Hiking  - 3-5 x daily - 7 x weekly - 1 sets - 10 reps - 3-5 seconds hold - Prone Hip Extension  - 1 x daily - 1 x weekly - 2 sets - 10 reps - 3-5 seconds hold - Supine Quadricep Sets  - 2 x daily - 7 x weekly - 2 sets - 10 reps - 5 second hold - Small Range Straight Leg Raise  - 2 x daily - 3 x weekly - 3-5 sets - 10 reps - 3 seconds hold  ASSESSMENT:  CLINICAL IMPRESSION: Yasenia notes very little low back or gluteal pain over the last week.  We spent some time today addressing quadriceps strength as it relates to her body mechanics for functional activities around the house.  These were added to her home exercise program  and we will review them next week where we will determine whether Aleksa feels comfortable enough with her current program to transfer into independent rehabilitation.  Most long-term goals have been met and she should be ready for transfer into independent rehabilitation when she is comfortable with her home exercise program.  From eval: Patient is a 68 y.o. female who was seen today for physical therapy evaluation and treatment for  Diagnosis  M54.41,G89.29 (ICD-10-CM) - Chronic right-sided low back pain with right-sided sciatica  .  Audriella notes she has a history of low back and gluteal pain that has worsened over the past few months, since she retired.  Pixie would like to be more physically active and by addressing limitations in lumbar active range of motion, hip flexibility and core strength, Tonna should be able to meet the below listed goals.  OBJECTIVE IMPAIRMENTS: Abnormal gait, cardiopulmonary status limiting activity, decreased activity tolerance, decreased endurance, decreased knowledge of condition, difficulty walking, decreased  ROM, decreased strength, decreased safety awareness, impaired perceived functional ability, impaired flexibility, improper body mechanics, postural dysfunction, obesity, and pain.   ACTIVITY LIMITATIONS: lifting, bending, sitting, standing, sleeping, stairs, transfers, bed mobility, and locomotion level  PARTICIPATION LIMITATIONS: cleaning and community activity  PERSONAL FACTORS: 1 kidney, CAD, DM Type 2, previous knee surgery and rotator cuff repair are also affecting patient's functional outcome.   REHAB POTENTIAL: Good  CLINICAL DECISION MAKING: Evolving/moderate complexity  EVALUATION COMPLEXITY: Moderate   GOALS: Goals reviewed with patient? Yes  SHORT TERM GOALS: Target date: 09/19/2023  Jamyla will be independent with her day 1 home exercise program Baseline: Started 08/22/2023 Goal status: Met 09/13/2023  2.  Improve bilateral hip ER AROM to at least 30 degrees Baseline: 20 degrees Goal status: Met 09/19/2023  3.  Jacquelynn will have an improved postural awareness and will be able to implement this into household and ADL activities Baseline: Benefited from education at evaluation Goal status: Met 09/19/2023   LONG TERM GOALS: Target date: 10/17/2023  Improve patient's specific functional score to 9/10 Baseline: 6.67 Goal status: On Going 09/19/2023  2.  Taniya report low back and bilateral gluteal pain no greater than 3/10 on the numeric pain rating scale Baseline: 5/10 Goal status: Met 09/19/2023  3.  Improve bilateral hip ER AROM to at least 40 degrees Baseline: 20 degrees Goal status: On Going 09/19/2023  4.  Carlesha will be able to walk for 20+ minutes continuously 3 days a week without increasing low back pain Baseline: Not completing at evaluation Goal status: Met 09/21/2023  5.  Shonica will be independent and compliant with her maintenance home exercise program at discharge Baseline: Started 08/22/2023 Goal status: On Going 09/21/2023  PLAN:  PT FREQUENCY: 1-2x/week  PT  DURATION: 8 weeks  PLANNED INTERVENTIONS: 97110-Therapeutic exercises, 97530- Therapeutic activity, 97112- Neuromuscular re-education, 97535- Self Care, [id number]- Manual therapy, [id number]- Gait training, [id number]- Traction (mechanical), [phone] (1-2 muscles), [id number] (3+ muscles)- Dry Needling, Patient/Family education, Stair training, Spinal mobilization, and Cryotherapy.  PLAN FOR NEXT SESSION: Review current home exercises.  How did the new quadricep strengthening activities go?  Re-do patient specific functional scale and assess hip external rotation active range of motion in preparation for possible discharge.   Myer LELON Ivory, PT, MPT 09/21/2023, 11:06 AM

## 2023-09-27 ENCOUNTER — Encounter: Payer: Self-pay | Admitting: Rehabilitative and Restorative Service Providers"

## 2023-09-27 ENCOUNTER — Ambulatory Visit (INDEPENDENT_AMBULATORY_CARE_PROVIDER_SITE_OTHER): Admitting: Rehabilitative and Restorative Service Providers"

## 2023-09-27 DIAGNOSIS — M5459 Other low back pain: Secondary | ICD-10-CM | POA: Diagnosis not present

## 2023-09-27 DIAGNOSIS — R293 Abnormal posture: Secondary | ICD-10-CM | POA: Diagnosis not present

## 2023-09-27 DIAGNOSIS — M6281 Muscle weakness (generalized): Secondary | ICD-10-CM

## 2023-09-27 NOTE — Therapy (Addendum)
 OUTPATIENT PHYSICAL THERAPY TREATMENT  PHYSICAL THERAPY DISCHARGE SUMMARY  Visits from Start of Care: 6  Current functional level related to goals / functional outcomes: See note   Remaining deficits: See note   Education / Equipment: HEP   Patient agrees to discharge. Patient goals were mostly met. Patient is being discharged due to not returning since the last visit.   Patient Name: Mallory Charles MRN: [id number] DOB:02-07-1956, 68 y.o., female Today's Date: 02/06/2024  END OF SESSION:      Past Medical History:  Diagnosis Date   ADD (attention deficit disorder)    Congenital absence of one kidney    Coronary artery disease    Depression    Diabetes mellitus without complication (HCC)    Edema of both lower extremities    Fatty liver    GERD (gastroesophageal reflux disease)    Hyperlipidemia    Hypertension    IBS (irritable bowel syndrome)    Incontinence    Kidney problem    Palpitation    Rapid heart beat    Rosacea    Vertigo    Past Surgical History:  Procedure Laterality Date   ABDOMINAL HYSTERECTOMY     APPENDECTOMY     CHOLECYSTECTOMY     KNEE SURGERY     LAPAROSCOPIC GASTRIC BANDING     ROTATOR CUFF REPAIR     TONSILLECTOMY     Patient Active Problem List   Diagnosis Date Noted   Current mild episode of major depressive disorder without prior episode 04/12/2021   Constipation 10/20/2020   Depression 10/20/2020   Diabetes mellitus (HCC) 10/20/2020   Class 3 severe obesity with serious comorbidity and body mass index (BMI) of 45.0 to 49.9 in adult (HCC) 10/20/2020    PCP: Butler Necessary, MD  REFERRING PROVIDER: Lonni GRADE. Vernetta, MD  REFERRING DIAG:  Diagnosis  M54.41,G89.29 (ICD-10-CM) - Chronic right-sided low back pain with right-sided sciatica    Rationale for Evaluation and Treatment: Rehabilitation  THERAPY DIAG:  Abnormal posture  Muscle weakness (generalized)  Other low back pain  ONSET DATE: Chronic,  worsening over the past few months  SUBJECTIVE:                                                                                                                                                                                           SUBJECTIVE STATEMENT: Chauntay reports a bit less HEP compliance this week and she felt the difference.  No issues once she got back on her exercises.  Still no pain meds this week.    PERTINENT HISTORY:  1 kidney, CAD, DM Type 2, previous knee surgery  and rotator cuff repair  PAIN:  Are you having pain? Yes: NPRS scale: Low back and gluteal pain as high as 0-4/10 low back and 0-1/10 gluteal (was 5/10) over the past week Pain location: Lower back and minimal gluteal Pain description: Stiffness in the AM, ache in the PM Aggravating factors: Morning stiffness, prolonged postures Relieving factors: NA  PRECAUTIONS: Back  RED FLAGS: None   WEIGHT BEARING RESTRICTIONS: No  FALLS:  Has patient fallen in last 6 months? 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  DIAGNOSTIC FINDINGS:  Grade 1-2 L5-S1 spondylolisthesis, severe left and moderate to severe right foraminal stenosis  PATIENT SURVEYS:  Patient-Specific Activity Scoring Scheme  0 represents "unable to perform." 10 represents "able to perform at prior level. 0 1 2 3 4 5 6 7 8 9  10 (Date and Score)   Activity Eval   09/19/2023 09/27/2023  1.  Moving first thing in the morning 6/10 9/10  9/10  2.  Standing for long periods of time 7/10 7/10  9.5/10  3.  Prolonged sitting 6/10 7/10 8.5/10  4.     5.     Score  6.67 average 7.67 9 Average   Total score = sum of the activity scores/number of activities Minimum detectable change (90%CI) for average score = 2 points Minimum detectable change (90%CI) for single activity score = 3 points     COGNITION: Overall cognitive status: Within functional limits for tasks assessed     SENSATION: No complaints of peripheral pain or paresthesias  MUSCLE LENGTH: 09/19/2023: Hamstrings: Right 55 deg; Left 50 deg  Eval: Hamstrings: Right 45 deg; Left 45 deg  POSTURE: Rrounded shoulders, forward head, and decreased lumbar lordosis  LUMBAR ROM:   AROM 08/22/2023 09/13/2023  Flexion    Extension 10 10  Right lateral flexion    Left lateral flexion    Right rotation    Left rotation     (Blank rows = not tested)  LOWER EXTREMITY ROM:     Passive  Left/Right 08/22/2022 Left/Right 09/19/2023 Left/Right 09/27/2023  Hip flexion 100/100 105/110   Hip extension     Hip abduction     Hip adduction     Hip internal rotation 12/20 20/22   Hip external rotation 20/20 30/32 30/30   Knee flexion     Knee extension     Ankle dorsiflexion     Ankle plantarflexion     Ankle inversion     Ankle eversion      (Blank rows = not tested)  Strength: Deferred secondary to difficulty in obtaining test positions  MMT Right eval Left eval  Hip flexion    Hip extension    Hip abduction    Hip adduction    Hip internal rotation    Hip external rotation    Knee flexion    Knee extension    Ankle dorsiflexion    Ankle plantarflexion    Ankle inversion    Ankle eversion     (Blank rows = not tested)  GAIT: Distance walked: 50 feet Assistive device utilized: None Level of assistance: Complete Independence Comments: Donise notes she is okay to walk,  but she has difficulty with prolonged sitting and standing, particularly if standing involves flexion  TREATMENT DATE: 09/27/2023 Standing lumbar extension AROM 10 x 3 seconds with hands on gluteals Standing shoulder  blade pinches/scapular retraction 10 x 5 seconds Supine figure 4 stretch 4 x 20 seconds bilateral Yoga Bridge 10 for 5 seconds Prone alternating hip extension 10 x 3 seconds Hip hike at counter top 2 sets of 10 for 3 seconds Seated straight leg raises 3 sets of 5 for 3 seconds with 1.5# weight  97535: Practical body mechanics including golfers and diagonal lifts, knee education (as it relates to lifting techniques and the spine), review updates to HEP   09/21/2023 Standing lumbar extension AROM 5 x 3 seconds with hands on gluteals Standing shoulder blade pinches/scapular retraction 5 x 5 seconds Quadriceps sets 2 sets of 10 for 5 seconds Supine figure 4 stretch 4 x 20 seconds bilateral Yoga Bridge 10 for 5 seconds Prone alternating hip extension 2 sets of 10 x 3 seconds Hip hike at counter top 2 sets of 10 for 3 seconds Seated straight leg raises 2 sets of 5 for 3 seconds  02464: Comprehensive review of body mechanics, knee education (as it relates to lifting techniques and the spine), review updates to HEP   09/19/2023 Standing lumbar extension AROM 10 x 3 seconds with hands on gluteals Standing shoulder blade pinches/scapular retraction 10 x 5 seconds Supine figure 4 stretch 4 x 20 seconds bilateral Yoga Bridge 10 for 5 seconds Prone alternating hip extension 10 x 3 seconds Superwoman 60 seconds Hip hike at counter top 2 sets of 10 for 3 seconds  02464: Practical log roll; practical golfer's lift; practical diagonal squat and modified diagonal squat lift   PATIENT EDUCATION:  Education details: See above Person educated: Patient Education method: Explanation, Demonstration, Tactile cues, Verbal cues, and Handouts Education comprehension: verbalized understanding, returned demonstration, verbal cues required, tactile cues required, and needs further education  HOME EXERCISE PROGRAM: Access Code: [email or web address] URL: [email or web address] Date: 09/21/2023 Prepared  by: Lamar Ivory  Exercises - Standing Lumbar Extension at Wall - Forearms  - 5 x daily - 7 x weekly - 1 sets - 5 reps - 3 seconds hold - Standing Scapular Retraction  - 5 x daily - 7 x weekly - 1 sets - 5 reps - 5 second hold - Supine Figure 4 Piriformis Stretch  - 2 x daily - 7 x weekly - 1 sets - 5 reps - 20 seconds hold - Yoga Bridge  - 1 x daily - 1 x weekly - 2 sets - 10 reps - 5 seconds hold - Standing Hip Hiking  - 3-5 x daily - 7 x weekly - 1 sets - 10 reps - 3-5 seconds hold - Prone Hip Extension  - 1 x daily - 1 x weekly - 2 sets - 10 reps - 3-5 seconds hold - Supine Quadricep Sets  - 2 x daily - 7 x weekly - 2 sets - 10 reps - 5 second hold - Small Range Straight Leg Raise  - 2 x daily - 3 x weekly - 3-5 sets - 10 reps - 3 seconds hold  ASSESSMENT:  CLINICAL IMPRESSION: Brandie had a slight increase in low back pain when she took a few days off from her home exercises.  When she got back on her normal home exercise program compliance schedule, her pain disappeared.  Davina has 1 visit remaining and was given the option to transfer into independent rehabilitation  today.  Uriel says she would like to have another week to work on her home exercises and to come back and ask any questions that she has before transfer into independent rehabilitation.  From eval: Patient is a 68 y.o. female who was seen today for physical therapy evaluation and treatment for  Diagnosis  M54.41,G89.29 (ICD-10-CM) - Chronic right-sided low back pain with right-sided sciatica  .  Tynesha notes she has a history of low back and gluteal pain that has worsened over the past few months, since she retired.  Meriah would like to be more physically active and by addressing limitations in lumbar active range of motion, hip flexibility and core strength, Kristain should be able to meet the below listed goals.  OBJECTIVE IMPAIRMENTS: Abnormal gait, cardiopulmonary status limiting activity, decreased activity tolerance,  decreased endurance, decreased knowledge of condition, difficulty walking, decreased ROM, decreased strength, decreased safety awareness, impaired perceived functional ability, impaired flexibility, improper body mechanics, postural dysfunction, obesity, and pain.   ACTIVITY LIMITATIONS: lifting, bending, sitting, standing, sleeping, stairs, transfers, bed mobility, and locomotion level  PARTICIPATION LIMITATIONS: cleaning and community activity  PERSONAL FACTORS: 1 kidney, CAD, DM Type 2, previous knee surgery and rotator cuff repair are also affecting patient's functional outcome.   REHAB POTENTIAL: Good  CLINICAL DECISION MAKING: Evolving/moderate complexity  EVALUATION COMPLEXITY: Moderate   GOALS: Goals reviewed with patient? Yes  SHORT TERM GOALS: Target date: 09/19/2023  Lorinda will be independent with her day 1 home exercise program Baseline: Started 08/22/2023 Goal status: Met 09/13/2023  2.  Improve bilateral hip ER AROM to at least 30 degrees Baseline: 20 degrees Goal status: Met 09/19/2023  3.  Anhar will have an improved postural awareness and will be able to implement this into household and ADL activities Baseline: Benefited from education at evaluation Goal status: Met 09/19/2023   LONG TERM GOALS: Target date: 10/17/2023  Improve patient's specific functional score to 9/10 Baseline: 6.67 Goal status: Met 09/27/2023 2.  Lachina report low back and bilateral gluteal pain no greater than 3/10 on the numeric pain rating scale Baseline: 5/10 Goal status: Met 09/19/2023  3.  Improve bilateral hip ER AROM to at least 40 degrees Baseline: 20 degrees Goal status: On Going 09/27/2023  4.  Yukari will be able to walk for 20+ minutes continuously 3 days a week without increasing low back pain Baseline: Not completing at evaluation Goal status: Met 09/21/2023  5.  Taisley will be independent and compliant with her maintenance home exercise program at discharge Baseline: Started  08/22/2023 Goal status: On Going [id number]  PLAN:  PT FREQUENCY: 1-2x/week  PT DURATION: 8 weeks  PLANNED INTERVENTIONS: 97110-Therapeutic exercises, 97530- Therapeutic activity, 97112- Neuromuscular re-education, 97535- Self Care, 02859- Manual therapy, [id number]- Gait training, [phone]- Traction (mechanical), [phone] (1-2 muscles), 20561 (3+ muscles)- Dry Needling, Patient/Family education, Stair training, Spinal mobilization, and Cryotherapy.  PLAN FOR NEXT SESSION: Review current home exercises and answer questions about home exercises for probable transfer into independent rehabilitation.  Myer LELON Ivory, PT, MPT 02/06/2024, 2:05 PM

## 2023-10-04 ENCOUNTER — Encounter: Admitting: Rehabilitative and Restorative Service Providers"

## 2024-01-14 ENCOUNTER — Encounter: Payer: Self-pay | Admitting: Radiology

## 2024-01-21 ENCOUNTER — Ambulatory Visit (INDEPENDENT_AMBULATORY_CARE_PROVIDER_SITE_OTHER): Admitting: Physician Assistant

## 2024-01-21 ENCOUNTER — Encounter: Payer: Self-pay | Admitting: Physician Assistant

## 2024-01-21 DIAGNOSIS — M545 Low back pain, unspecified: Secondary | ICD-10-CM | POA: Diagnosis not present

## 2024-01-21 MED ORDER — METHYLPREDNISOLONE 4 MG PO TBPK: ORAL_TABLET | ORAL | 0 refills | Status: AC

## 2024-01-21 NOTE — Progress Notes (Signed)
 HPI Mallory Charles 68 year old female comes in today with back pain.  She was last seen in this [place] early summer by Dr. Vernetta for back pain.  She did go to therapy and this was helpful.  However last Thursday she twisted and something popped hip area.  On Friday she was seen at [facility].  She states x-rays of her lumbar spine and her hip showed a slipped disc at L5-S1 and some spurring of the upper lumbar spine.  She was told her hip was fine.  She has been taking Tylenol  for pain.  She rates her pain to be 7 out of 10 pain.  No real radicular symptoms down either leg.  Pain does awaken her.  No bowel bladder dysfunction, saddle anesthesia, weight changes or fevers chills.  Feels this pain is similar to what she has had in the past.  Again she underwent an MRI of her lumbar spine on 06/22/2023 through [facility] and this showed a grade 1 to grade 2 spondylolisthesis at L5-S1 causing moderate to severe stenosis on the right and severe stenosis on the left.  She is unable to take NSAIDs due to the fact she only has 1 kidney.  She also does not like to take Tylenol .  She reports she has fatty liver.  She is diabetic last hemoglobin A1c was 5.8.  She states that her glucose levels which she checks daily at home are usually not above 118-124.  Reports that she has an appointment with Dr. Gifford on 02/21/2024 for ESI injections.  Review of systems: See HPI otherwise negative  Physical exam: General Well-developed well-nourished female no acute distress mood and affect appropriate.  Walks without any assistive device. Lower extremities: 5 out of 5 strength throughout lower extremities negative straight leg raise bilaterally.  Good range of motion of both hips without pain.  Right hip trochanteric tenderness.  She is able to bend and touch toes.  She has slightly limited extension of the lumbar spine without pain.  Tenderness right lower lumbar paraspinous region.   Impression: Low back pain without radicular  symptoms  Plan: She would like to remain as conservative as possible.  Therefore we will put her on a Medrol  Dosepak send her back to physical therapy here at our office for back exercises, stretching, core strengthening, home exercise program and modalities.  She will follow-up with Dr. Vernetta as scheduled on December 8.'s she will contact our office if her condition becomes worse in any way.

## 2024-01-22 ENCOUNTER — Other Ambulatory Visit: Payer: Self-pay | Admitting: Radiology

## 2024-01-22 DIAGNOSIS — G8929 Other chronic pain: Secondary | ICD-10-CM

## 2024-02-10 NOTE — Therapy (Signed)
 OUTPATIENT PHYSICAL THERAPY THORACOLUMBAR EVALUATION   Patient Name: Mallory Charles MRN: [id number] DOB:11-07-1955, 68 y.o., female Today's Date: 02/12/2024  END OF SESSION:  PT End of Session - 02/12/24 1322     Visit Number 1    Number of Visits 9    Date for Recertification  04/08/24    PT Start Time 1145    PT Stop Time 1225    PT Time Calculation (min) 40 min    Activity Tolerance Patient tolerated treatment well    Behavior During Therapy WFL for tasks assessed/performed          Past Medical History:  Diagnosis Date   ADD (attention deficit disorder)    Congenital absence of one kidney    Coronary artery disease    Depression    Diabetes mellitus without complication (HCC)    Edema of both lower extremities    Fatty liver    GERD (gastroesophageal reflux disease)    Hyperlipidemia    Hypertension    IBS (irritable bowel syndrome)    Incontinence    Kidney problem    Palpitation    Rapid heart beat    Rosacea    Vertigo    Past Surgical History:  Procedure Laterality Date   ABDOMINAL HYSTERECTOMY     APPENDECTOMY     CHOLECYSTECTOMY     KNEE SURGERY     LAPAROSCOPIC GASTRIC BANDING     ROTATOR CUFF REPAIR     TONSILLECTOMY     Patient Active Problem List   Diagnosis Date Noted   Current mild episode of major depressive disorder without prior episode 04/12/2021   Constipation 10/20/2020   Depression 10/20/2020   Diabetes mellitus (HCC) 10/20/2020   Class 3 severe obesity with serious comorbidity and body mass index (BMI) of 45.0 to 49.9 in adult (HCC) 10/20/2020    PCP: Marshall Lowers, MD   REFERRING PROVIDER: Gretta Bertrum ORN, PA-C   REFERRING DIAG:  Diagnosis  M54.41,G89.29 (ICD-10-CM) - Chronic right-sided low back pain with right-sided sciatica    Rationale for Evaluation and Treatment: Rehabilitation  THERAPY DIAG:  Abnormal posture  Muscle weakness (generalized)  Other low back pain  Pain in right hip  ONSET DATE:  chronic  SUBJECTIVE:                                                                                                                                                                                           SUBJECTIVE STATEMENT: Several weeks ago had increased R buttuck and LBP after turning towards the back seat.  Pain was severe and has decreased since then.  Pt reports +  hx/o LBP.  PERTINENT HISTORY:  no spinal surgeries  PAIN:  Are you having pain? Yes: NPRS scale: 2/10 Pain location: R buttuck and R lumbar Pain description: sharp Aggravating factors: prolonged standing, transfers Relieving factors: OTC  PRECAUTIONS: None  RED FLAGS: None   WEIGHT BEARING RESTRICTIONS: No  FALLS:  Has patient fallen in last 6 months? No  LIVING ENVIRONMENT: Lives with: lives alone Lives in: House/apartment Stairs: Yes: External: 3 steps; on right going up Has following equipment at home: None  OCCUPATION: retired  PLOF: Independent  PATIENT GOALS: Decrease pain and increase mobility  NEXT MD VISIT: 02/18/24  OBJECTIVE:  Note: Objective measures were completed at Evaluation unless otherwise noted.  DIAGNOSTIC FINDINGS:  08/23/23--2 views of the lumbar spine show a degenerative scoliosis as well as  degenerative changes at multiple levels.  There is a grade 1 grade 2  spondylolisthesis of the lower aspect of her lumbar spine.   PATIENT SURVEYS:  PSFS: THE PATIENT SPECIFIC FUNCTIONAL SCALE  Place score of 0-10 (0 = unable to perform activity and 10 = able to perform activity at the same level as before injury or problem)  Activity Date: 02/12/24    Standing for 15 min 5    2.Sitting for 30 min 8    3.     4.      Total Score 13/2=6.5      Total Score = Sum of activity scores/number of activities  Minimally Detectable Change: 3 points (for single activity); 2 points (for average score)  [employer] (nd). The Patient Specific Functional Scale . Retrieved from  [email or web address].pt   COGNITION: Overall cognitive status: Within functional limits for tasks assessed     SENSATION: WFL  MUSCLE LENGTH: Hamstrings: Moderate restriction bilaterally  POSTURE: feet ER, flattened lumbar lordosis  PALPATION: No palpable tenderness  LUMBAR ROM:   AROM Eval 12/2  Flexion 75%  Extension WNL  Right lateral flexion WNL  Left lateral flexion WNL  Right rotation WNL  Left rotation WNL   (Blank rows = not tested)  LOWER EXTREMITY ROM:   R hip ROM WFL  Active  Right eval Left eval  Hip flexion ---   Hip extension    Hip abduction    Hip adduction    Hip internal rotation    Hip external rotation    Knee flexion    Knee extension    Ankle dorsiflexion    Ankle plantarflexion    Ankle inversion    Ankle eversion     (Blank rows = not tested)  LOWER EXTREMITY MMT:    MMT Right eval Left eval  Hip flexion 4 4  Hip extension    Hip abduction 4+ 4+  Hip adduction    Hip internal rotation    Hip external rotation    Knee flexion    Knee extension 4- 4  Ankle dorsiflexion 4+ 4  Ankle plantarflexion 4+ 4  Ankle inversion    Ankle eversion     (Blank rows = not tested)  LUMBAR SPECIAL TESTS:  Straight leg raise test: Negative  FUNCTIONAL TESTS:  5 times sit to stand:   11 sec with UE use  GAIT: Distance walked: short community  Assistive device utilized: None Level of assistance: Complete Independence Comments: ---  TREATMENT DATE: 02/12/24  Initial evaluation of lumbar spine completed followed by instruction and trial set of HEP.  PATIENT EDUCATION:  Education details: HEP Person educated: Patient Education method: Explanation, Demonstration, Actor cues, and Verbal cues Education comprehension: verbalized understanding, returned  demonstration, verbal cues required, and tactile cues required  HOME EXERCISE PROGRAM: Access Code: [email or web address] URL: [email or web address] Date: 02/12/2024 Prepared by: Burnard Meth  Exercises - Supine Bridge  - 2 x daily - 7 x weekly - 2 sets - 10 reps - 2 hold - Supine March  - 2 x daily - 7 x weekly - 2 sets - 20 reps - Supine Lower Trunk Rotation  - 2 x daily - 7 x weekly - 2 sets - 20 reps - 2 hold - Supine Single Knee to Chest Stretch  - 2 x daily - 7 x weekly - 1 sets - 3 reps - 25 hold  ASSESSMENT: 02/12/24 Initial evaluation of lumbar spine completed followed by instruction and trial set of HEP.  CLINICAL IMPRESSION: Patient is a 68y.o. female who was seen today for physical therapy evaluation and treatment for low back pain.  She presents with mildly restricted ROM, decreased flexibility, decreased strength and pain. Patient would benefit from skilled physical therapy to address the above deficits and return to previous level of activity.  OBJECTIVE IMPAIRMENTS: difficulty walking, decreased ROM, decreased strength, impaired flexibility, and pain.   ACTIVITY LIMITATIONS: bending, standing, and transfers  PARTICIPATION LIMITATIONS: meal prep and community activity  PERSONAL FACTORS: 1-2 comorbidities: Diabetes, Hypertension are also affecting patient's functional outcome.   REHAB POTENTIAL: Good  CLINICAL DECISION MAKING: Stable/uncomplicated  EVALUATION COMPLEXITY: Low   GOALS: Goals reviewed with patient? Yes  SHORT TERM GOALS: Target date: 03/04/2024   Patient to be independent with HEP. Baseline: Goal status: INITIAL  2.  Decreased pain by 1 level. Baseline:  Goal status: INITIAL  LONG TERM GOALS: Target date: 04/08/2024  8 weeks    Patient to be independent with self progressive HEP at discharge. Baseline:  Goal status: INITIAL  2.  Reduce max pain pain with all activities 2 out of 10 or less. Baseline:  Goal status: INITIAL  3.   Increase active range of motion by 25% with flexion. Baseline:  Goal status: INITIAL  4.  Increase strength by half a grade to 1 full grade in affected musculature. Baseline:  Goal status: INITIAL  5.  Improved score of PS FS by 3 points or more for measurable improvement. Baseline:  Goal status: INITIAL  6.  Improve flexibility to minimum restriction in hamstrings. Baseline:  Goal status: INITIAL  PLAN:  PT FREQUENCY: 1x/week  PT DURATION: 8 weeks  PLANNED INTERVENTIONS: 97164- PT Re-evaluation, 97110-Therapeutic exercises, 97530- Therapeutic activity, 97112- Neuromuscular re-education, 97535- Self Care, 02859- Manual therapy, [phone]- Gait training, [phone]- Aquatic Therapy, [phone]- Electrical stimulation (unattended), [phone] (1-2 muscles), [phone] (3+ muscles)- Dry Needling, Patient/Family education, Balance training, Stair training, Joint mobilization, Spinal mobilization, Cryotherapy, and Moist heat.  PLAN FOR NEXT SESSION: review HEP.   Burnard CHRISTELLA Meth, PT 02/12/2024, 1:24 PM

## 2024-02-12 ENCOUNTER — Ambulatory Visit

## 2024-02-12 ENCOUNTER — Other Ambulatory Visit: Payer: Self-pay

## 2024-02-12 DIAGNOSIS — M25551 Pain in right hip: Secondary | ICD-10-CM | POA: Diagnosis not present

## 2024-02-12 DIAGNOSIS — R293 Abnormal posture: Secondary | ICD-10-CM | POA: Diagnosis not present

## 2024-02-12 DIAGNOSIS — M6281 Muscle weakness (generalized): Secondary | ICD-10-CM

## 2024-02-12 DIAGNOSIS — M5459 Other low back pain: Secondary | ICD-10-CM | POA: Diagnosis not present

## 2024-02-18 ENCOUNTER — Ambulatory Visit: Admitting: Orthopaedic Surgery

## 2024-02-21 NOTE — Therapy (Incomplete)
 OUTPATIENT PHYSICAL THERAPY THORACOLUMBAR TREATMENT   Patient Name: Mallory Charles MRN: [id number] DOB:[birth year], 68 y.o., female Today's Date: 02/21/2024  END OF SESSION:    Past Medical History:  Diagnosis Date   ADD (attention deficit disorder)    Congenital absence of one kidney    Coronary artery disease    Depression    Diabetes mellitus without complication (HCC)    Edema of both lower extremities    Fatty liver    GERD (gastroesophageal reflux disease)    Hyperlipidemia    Hypertension    IBS (irritable bowel syndrome)    Incontinence    Kidney problem    Palpitation    Rapid heart beat    Rosacea    Vertigo    Past Surgical History:  Procedure Laterality Date   ABDOMINAL HYSTERECTOMY     APPENDECTOMY     CHOLECYSTECTOMY     KNEE SURGERY     LAPAROSCOPIC GASTRIC BANDING     ROTATOR CUFF REPAIR     TONSILLECTOMY     Patient Active Problem List   Diagnosis Date Noted   Current mild episode of major depressive disorder without prior episode 04/12/2021   Constipation 10/20/2020   Depression 10/20/2020   Diabetes mellitus (HCC) 10/20/2020   Class 3 severe obesity with serious comorbidity and body mass index (BMI) of 45.0 to 49.9 in adult (HCC) 10/20/2020    PCP: Marshall Lowers, MD   REFERRING PROVIDER: Marshall Lowers, MD   REFERRING DIAG:  Diagnosis  M54.41,G89.29 (ICD-10-CM) - Chronic right-sided low back pain with right-sided sciatica    Rationale for Evaluation and Treatment: Rehabilitation  THERAPY DIAG:  No diagnosis found.  ONSET DATE: chronic  SUBJECTIVE:                                                                                                                                                                                           SUBJECTIVE STATEMENT: ***   PERTINENT HISTORY:  no spinal surgeries  Several weeks ago had increased R buttuck and LBP after turning towards the back seat.  Pain was severe and has  decreased since then.  Pt reports + hx/o LBP.  PAIN:  Are you having pain? Yes: NPRS scale: 2/10 Pain location: R buttuck and R lumbar Pain description: sharp Aggravating factors: prolonged standing, transfers Relieving factors: OTC  PRECAUTIONS: None  RED FLAGS: None   WEIGHT BEARING RESTRICTIONS: No  FALLS:  Has patient fallen in last 6 months? No  LIVING ENVIRONMENT: Lives with: lives alone Lives in: House/apartment Stairs: Yes: External: 3 steps; on right going up Has following equipment at home: None  OCCUPATION: retired  PLOF: Independent  PATIENT GOALS: Decrease pain and increase mobility  NEXT MD VISIT: 02/18/24  OBJECTIVE:  Note: Objective measures were completed at Evaluation unless otherwise noted.  DIAGNOSTIC FINDINGS:  08/23/23--2 views of the lumbar spine show a degenerative scoliosis as well as  degenerative changes at multiple levels.  There is a grade 1 grade 2  spondylolisthesis of the lower aspect of her lumbar spine.   PATIENT SURVEYS:  PSFS: THE PATIENT SPECIFIC FUNCTIONAL SCALE  Place score of 0-10 (0 = unable to perform activity and 10 = able to perform activity at the same level as before injury or problem)  Activity Date: 02/12/24    Standing for 15 min 5    2.Sitting for 30 min 8    3.     4.      Total Score 13/2=6.5      Total Score = Sum of activity scores/number of activities  Minimally Detectable Change: 3 points (for single activity); 2 points (for average score)  [facility] (nd). The Patient Specific Functional Scale . Retrieved from [email or web address]   COGNITION: Overall cognitive status: Within functional limits for tasks assessed     SENSATION: WFL  MUSCLE LENGTH: Hamstrings: Moderate restriction bilaterally  POSTURE: feet ER, flattened lumbar lordosis  PALPATION: No palpable tenderness  LUMBAR ROM:   AROM Eval 12/2  Flexion 75%  Extension  WNL  Right lateral flexion WNL  Left lateral flexion WNL  Right rotation WNL  Left rotation WNL   (Blank rows = not tested)  LOWER EXTREMITY ROM:   R hip ROM WFL  Active  Right eval Left eval  Hip flexion ---   Hip extension    Hip abduction    Hip adduction    Hip internal rotation    Hip external rotation    Knee flexion    Knee extension    Ankle dorsiflexion    Ankle plantarflexion    Ankle inversion    Ankle eversion     (Blank rows = not tested)  LOWER EXTREMITY MMT:    MMT Right eval Left eval  Hip flexion 4 4  Hip extension    Hip abduction 4+ 4+  Hip adduction    Hip internal rotation    Hip external rotation    Knee flexion    Knee extension 4- 4  Ankle dorsiflexion 4+ 4  Ankle plantarflexion 4+ 4  Ankle inversion    Ankle eversion     (Blank rows = not tested)  LUMBAR SPECIAL TESTS:  Straight leg raise test: Negative  FUNCTIONAL TESTS:  5 times sit to stand:   11 sec with UE use  GAIT: Distance walked: short community  Assistive device utilized: None Level of assistance: Complete Independence Comments: ---  TREATMENT DATE:  02/22/2024 ***   02/12/24  Initial evaluation of lumbar spine completed followed by instruction and trial set of HEP.  PATIENT EDUCATION:  Education details: HEP Person educated: Patient Education method: Explanation, Demonstration, Actor cues, and Verbal cues Education comprehension: verbalized understanding, returned demonstration, verbal cues required, and tactile cues required  HOME EXERCISE PROGRAM: Access Code: [email or web address] URL: [email or web address] Date: 02/12/2024 Prepared by: Burnard Meth  Exercises - Supine Bridge  - 2 x daily - 7 x weekly - 2 sets - 10 reps - 2 hold - Supine March  - 2 x daily - 7 x weekly - 2 sets - 20 reps - Supine Lower Trunk Rotation  - 2  x daily - 7 x weekly - 2 sets - 20 reps - 2 hold - Supine Single Knee to Chest Stretch  - 2 x daily - 7 x weekly - 1 sets - 3 reps - 25 hold  ASSESSMENT: 02/12/24 Initial evaluation of lumbar spine completed followed by instruction and trial set of HEP.  CLINICAL IMPRESSION: *** Patient is a 68y.o. female who was seen today for physical therapy evaluation and treatment for low back pain.  She presents with mildly restricted ROM, decreased flexibility, decreased strength and pain. Patient would benefit from skilled physical therapy to address the above deficits and return to previous level of activity.  OBJECTIVE IMPAIRMENTS: difficulty walking, decreased ROM, decreased strength, impaired flexibility, and pain.   ACTIVITY LIMITATIONS: bending, standing, and transfers  PARTICIPATION LIMITATIONS: meal prep and community activity  PERSONAL FACTORS: 1-2 comorbidities: Diabetes, Hypertension are also affecting patient's functional outcome.   REHAB POTENTIAL: Good  CLINICAL DECISION MAKING: Stable/uncomplicated  EVALUATION COMPLEXITY: Low   GOALS: Goals reviewed with patient? Yes  SHORT TERM GOALS: Target date: 03/04/2024   Patient to be independent with HEP. Baseline: Goal status: INITIAL  2.  Decreased pain by 1 level. Baseline:  Goal status: INITIAL  LONG TERM GOALS: Target date: 04/08/2024  8 weeks    Patient to be independent with self progressive HEP at discharge. Baseline:  Goal status: INITIAL  2.  Reduce max pain pain with all activities 2 out of 10 or less. Baseline:  Goal status: INITIAL  3.  Increase active range of motion by 25% with flexion. Baseline:  Goal status: INITIAL  4.  Increase strength by half a grade to 1 full grade in affected musculature. Baseline:  Goal status: INITIAL  5.  Improved score of PS FS by 3 points or more for measurable improvement. Baseline:  Goal status: INITIAL  6.  Improve flexibility to minimum restriction in  hamstrings. Baseline:  Goal status: INITIAL  PLAN:  PT FREQUENCY: 1x/week  PT DURATION: 8 weeks  PLANNED INTERVENTIONS: [id number]- PT Re-evaluation, 97110-Therapeutic exercises, 97530- Therapeutic activity, 97112- Neuromuscular re-education, 97535- Self Care, [id number]- Manual therapy, [phone]- Gait training, [phone]- Aquatic Therapy, [phone]- Electrical stimulation (unattended), [phone] (1-2 muscles), [id number] (3+ muscles)- Dry Needling, Patient/Family education, Balance training, Stair training, Joint mobilization, Spinal mobilization, Cryotherapy, and Moist heat.  PLAN FOR NEXT SESSION: review HEP. ***   Susannah Daring, PT, DPT 02/21/2024 8:50 AM

## 2024-02-22 ENCOUNTER — Encounter

## 2024-02-27 ENCOUNTER — Encounter: Payer: Self-pay | Admitting: Radiology

## 2024-02-29 ENCOUNTER — Encounter

## 2024-03-03 ENCOUNTER — Ambulatory Visit: Admitting: Physician Assistant

## 2024-03-04 ENCOUNTER — Encounter: Admitting: Physical Therapy

## 2024-03-12 ENCOUNTER — Encounter

## 2024-03-17 ENCOUNTER — Encounter
